# Patient Record
Sex: Female | Born: 1995 | Race: Asian | Hispanic: No | Marital: Married | Smoking: Never smoker
Health system: Southern US, Community
[De-identification: ages and names within clinical notes are randomized; demographics above are authoritative.]

## PROBLEM LIST (undated history)

## (undated) ENCOUNTER — Inpatient Hospital Stay (HOSPITAL_COMMUNITY): Payer: Self-pay

## (undated) DIAGNOSIS — J45909 Unspecified asthma, uncomplicated: Secondary | ICD-10-CM

## (undated) HISTORY — PX: NO PAST SURGERIES: SHX2092

---
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
## 2016-03-04 ENCOUNTER — Encounter (HOSPITAL_COMMUNITY): Payer: Self-pay

## 2016-09-26 ENCOUNTER — Encounter (HOSPITAL_COMMUNITY): Payer: Self-pay | Admitting: Emergency Medicine

## 2016-09-26 ENCOUNTER — Emergency Department (HOSPITAL_COMMUNITY)
Admission: EM | Admit: 2016-09-26 | Discharge: 2016-09-26 | Disposition: A | Discharge status: Home / Self Care | Payer: Medicaid Other | Attending: Emergency Medicine | Admitting: Emergency Medicine

## 2016-09-26 DIAGNOSIS — J45909 Unspecified asthma, uncomplicated: Secondary | ICD-10-CM | POA: Insufficient documentation

## 2016-09-26 DIAGNOSIS — R197 Diarrhea, unspecified: Secondary | ICD-10-CM | POA: Diagnosis not present

## 2016-09-26 DIAGNOSIS — R112 Nausea with vomiting, unspecified: Secondary | ICD-10-CM | POA: Diagnosis present

## 2016-09-26 LAB — COMPREHENSIVE METABOLIC PANEL
ALT: 15 U/L (ref 14–54)
AST: 21 U/L (ref 15–41)
Albumin: 3.8 g/dL (ref 3.5–5.0)
Alkaline Phosphatase: 50 U/L (ref 38–126)
Anion gap: 8 (ref 5–15)
BILIRUBIN TOTAL: 0.7 mg/dL (ref 0.3–1.2)
BUN: 13 mg/dL (ref 6–20)
CO2: 23 mmol/L (ref 22–32)
CREATININE: 0.74 mg/dL (ref 0.44–1.00)
Calcium: 8.3 mg/dL — ABNORMAL LOW (ref 8.9–10.3)
Chloride: 107 mmol/L (ref 101–111)
Glucose, Bld: 100 mg/dL — ABNORMAL HIGH (ref 65–99)
POTASSIUM: 3.4 mmol/L — AB (ref 3.5–5.1)
Sodium: 138 mmol/L (ref 135–145)
TOTAL PROTEIN: 6.9 g/dL (ref 6.5–8.1)

## 2016-09-26 LAB — URINALYSIS, ROUTINE W REFLEX MICROSCOPIC
Bilirubin Urine: NEGATIVE
Glucose, UA: NEGATIVE mg/dL
Hgb urine dipstick: NEGATIVE
KETONES UR: NEGATIVE mg/dL
LEUKOCYTES UA: NEGATIVE
NITRITE: NEGATIVE
PROTEIN: NEGATIVE mg/dL
Specific Gravity, Urine: 1.03 (ref 1.005–1.030)
pH: 5 (ref 5.0–8.0)

## 2016-09-26 LAB — CBC
HCT: 38.6 % (ref 36.0–46.0)
Hemoglobin: 13.1 g/dL (ref 12.0–15.0)
MCH: 28.5 pg (ref 26.0–34.0)
MCHC: 33.9 g/dL (ref 30.0–36.0)
MCV: 84.1 fL (ref 78.0–100.0)
PLATELETS: 202 10*3/uL (ref 150–400)
RBC: 4.59 MIL/uL (ref 3.87–5.11)
RDW: 14.1 % (ref 11.5–15.5)
WBC: 10.3 10*3/uL (ref 4.0–10.5)

## 2016-09-26 LAB — I-STAT BETA HCG BLOOD, ED (MC, WL, AP ONLY)

## 2016-09-26 LAB — LIPASE, BLOOD: Lipase: 30 U/L (ref 11–51)

## 2016-09-26 MED ORDER — POTASSIUM CHLORIDE CRYS ER 20 MEQ PO TBCR
20.0000 meq | EXTENDED_RELEASE_TABLET | Freq: Once | ORAL | Status: AC
  Administered 2016-09-26: 20 meq via ORAL
  Filled 2016-09-26: qty 1

## 2016-09-26 MED ORDER — ONDANSETRON 4 MG PO TBDP
8.0000 mg | ORAL_TABLET | Freq: Once | ORAL | Status: AC
  Administered 2016-09-26: 8 mg via ORAL
  Filled 2016-09-26: qty 2

## 2016-09-26 MED ORDER — ONDANSETRON HCL 4 MG PO TABS: 4.0000 mg | ORAL_TABLET | Freq: Three times a day (TID) | ORAL | 0 refills | Status: DC | PRN

## 2016-09-26 NOTE — Discharge Instructions (Signed)
I have attached information on nausea and vomiting. Take Zofran as directed for nausea. Follow with primary care in the next 2 days for persistent symptoms. He can return to the emergency department at any time if symptoms worsen.

## 2016-09-26 NOTE — ED Triage Notes (Signed)
Pt sts N/V x 2 days with dizziness and bilateral hand tingling

## 2016-09-26 NOTE — ED Provider Notes (Signed)
[facility] Provider Note   CSN: [id number] Arrival date & time: 09/26/16  1021     History   Chief Complaint Chief Complaint  Patient presents with  . Emesis    HPI Vanessa Macdonald is a 21 y.o. female with no significant past medical history who presents to the emergency department for nausea, vomiting, diarrhea that began yesterday at 6:30 PM. The patient notes that she was driving home from work yesterday when she became nauseous. When she returned home she had nonbilious, nonbloody emesis 10. She also experienced diarrhea 3. During the vomiting she noted mild, crampy lower abdominal pain. The pain is longer persists. Her last episode of vomiting and diarrhea was at 4 AM this morning. She has been able to eat a [place] for breakfast down. The patient also notes that she has the feeling of "her hands and feeling asleep "after vomiting this morning. She presents today for ongoing nausea. She denies fever, chills, bodyaches, sore throat, chest pain, shortness of breath, melena, urinary frequency, urinary hesitancy, vaginal bleeding, vaginal discharge. Patient's last period was last week. She notes that it was normal for her.  HPI  Past Medical History:  Diagnosis Date  . Asthma     There are no active problems to display for this patient.   Past Surgical History:  Procedure Laterality Date  . NO PAST SURGERIES      OB History    Gravida Para Term Preterm AB Living   2 1 1     1    SAB TAB Ectopic Multiple Live Births           1       Home Medications    Prior to Admission medications   Medication Sig Start Date End Date Taking? Authorizing Provider  acetaminophen (TYLENOL) 500 MG tablet Take 500 mg by mouth every 6 (six) hours as needed for moderate pain or fever.   Yes [provider]  ondansetron (ZOFRAN) 4 MG tablet Take 1 tablet (4 mg total) by mouth every 8 (eight) hours as needed for nausea or vomiting. 09/26/16   Jovane Foutz, Elmer SowMichael M, PA-C    Family  History History reviewed. No pertinent family history.  Social History Social History  Substance Use Topics  . Smoking status: Never Smoker  . Smokeless tobacco: Never Used  . Alcohol use No     Allergies   Patient has no known allergies.   Review of Systems Review of Systems  All other systems reviewed and are negative.    Physical Exam Updated Vital Signs BP 99/69 (BP Location: Left Arm)   Pulse 90   Temp 98.5 F (36.9 C) (Oral)   Resp 16   SpO2 100%   Physical Exam  Constitutional: She appears well-developed and well-nourished.  HENT:  Head: Normocephalic and atraumatic.  Right Ear: External ear normal.  Left Ear: External ear normal.  Mouth/Throat: Uvula is midline. Posterior oropharyngeal erythema (mild) present.  Eyes: Conjunctivae are normal. Pupils are equal, round, and reactive to light.  Neck: Neck supple.  Cardiovascular: Normal rate, regular rhythm and intact distal pulses.   No murmur heard. Pulmonary/Chest: Effort normal and breath sounds normal. She exhibits no tenderness.  Abdominal: Soft. Bowel sounds are normal. There is no tenderness. There is no rebound, no guarding, no CVA tenderness, no tenderness at McBurney's point and negative Murphy's sign.  Negatvie psoas and obturator sign.  Musculoskeletal: She exhibits no edema.  Lymphadenopathy:    She has no cervical adenopathy.  Neurological: She is alert. No sensory deficit.  Skin: No rash noted. She is not diaphoretic.  Psychiatric: She has a normal mood and affect.  Nursing note and vitals reviewed.    ED Treatments / Results  Labs (all labs ordered are listed, but only abnormal results are displayed) Labs Reviewed  COMPREHENSIVE METABOLIC PANEL - Abnormal; Notable for the following:       Result Value   Potassium 3.4 (*)    Glucose, Bld 100 (*)    Calcium 8.3 (*)    All other components within normal limits  URINALYSIS, ROUTINE W REFLEX MICROSCOPIC - Abnormal; Notable for the  following:    APPearance HAZY (*)    All other components within normal limits  LIPASE, BLOOD  CBC  I-STAT BETA HCG BLOOD, ED ([facility], [facility], [facility] ONLY)    EKG  EKG Interpretation None       Radiology No results found.  Procedures Procedures (including critical care time)  Medications Ordered in ED Medications  ondansetron (ZOFRAN-ODT) disintegrating tablet 8 mg (8 mg Oral Given 09/26/16 1303)  potassium chloride SA (K-DUR,KLOR-CON) CR tablet 20 mEq (20 mEq Oral Given 09/26/16 1349)     Initial Impression / Assessment and Plan / ED Course  I have reviewed the triage vital signs and the nursing notes.  Pertinent labs & imaging results that were available during my care of the patient were reviewed by me and considered in my medical decision making (see chart for details).     Zofran ODT given. We will by mouth fluid challenge as patient has established she was able to eat this morning. CBC, UA, hCG, lipase negative. Potassium low, replaced orally.  Patient nausea improved with zofran and po water. Able to hold down fluids. No vomiting currently. Patient without complaints. K replaced. Will send home with Zofran prescription. Suspect this is a viral etiology. Return precautions given. Patient to return at anytime is symptoms worsen.  Final Clinical Impressions(s) / ED Diagnoses   Final diagnoses:  Nausea and vomiting in adult    New Prescriptions New Prescriptions   ONDANSETRON (ZOFRAN) 4 MG TABLET    Take 1 tablet (4 mg total) by mouth every 8 (eight) hours as needed for nausea or vomiting.     Jacinto Halim, PA-C 09/26/16 1400    Little, Ambrose Finland, MD 09/26/16 [phone]

## 2016-12-25 ENCOUNTER — Emergency Department (HOSPITAL_BASED_OUTPATIENT_CLINIC_OR_DEPARTMENT_OTHER)
Admission: EM | Admit: 2016-12-25 | Discharge: 2016-12-25 | Disposition: A | Discharge status: Home / Self Care | Payer: Medicaid Other | Attending: Emergency Medicine | Admitting: Emergency Medicine

## 2016-12-25 ENCOUNTER — Emergency Department (HOSPITAL_BASED_OUTPATIENT_CLINIC_OR_DEPARTMENT_OTHER): Payer: Medicaid Other

## 2016-12-25 ENCOUNTER — Encounter (HOSPITAL_BASED_OUTPATIENT_CLINIC_OR_DEPARTMENT_OTHER): Payer: Self-pay | Admitting: *Deleted

## 2016-12-25 DIAGNOSIS — Y92009 Unspecified place in unspecified non-institutional (private) residence as the place of occurrence of the external cause: Secondary | ICD-10-CM | POA: Diagnosis not present

## 2016-12-25 DIAGNOSIS — J45909 Unspecified asthma, uncomplicated: Secondary | ICD-10-CM | POA: Diagnosis not present

## 2016-12-25 DIAGNOSIS — S161XXA Strain of muscle, fascia and tendon at neck level, initial encounter: Secondary | ICD-10-CM | POA: Insufficient documentation

## 2016-12-25 DIAGNOSIS — Y999 Unspecified external cause status: Secondary | ICD-10-CM | POA: Diagnosis not present

## 2016-12-25 DIAGNOSIS — Z79899 Other long term (current) drug therapy: Secondary | ICD-10-CM | POA: Insufficient documentation

## 2016-12-25 DIAGNOSIS — R51 Headache: Secondary | ICD-10-CM | POA: Diagnosis not present

## 2016-12-25 DIAGNOSIS — S0990XA Unspecified injury of head, initial encounter: Secondary | ICD-10-CM | POA: Diagnosis present

## 2016-12-25 DIAGNOSIS — Y939 Activity, unspecified: Secondary | ICD-10-CM | POA: Insufficient documentation

## 2016-12-25 MED ORDER — DIAZEPAM 5 MG PO TABS
5.0000 mg | ORAL_TABLET | Freq: Once | ORAL | Status: AC
  Administered 2016-12-25: 5 mg via ORAL
  Filled 2016-12-25: qty 1

## 2016-12-25 MED ORDER — IBUPROFEN 800 MG PO TABS
800.0000 mg | ORAL_TABLET | Freq: Once | ORAL | Status: AC
  Administered 2016-12-25: 800 mg via ORAL
  Filled 2016-12-25: qty 1

## 2016-12-25 MED ORDER — OXYCODONE HCL 5 MG PO TABS
5.0000 mg | ORAL_TABLET | Freq: Once | ORAL | Status: AC
  Administered 2016-12-25: 5 mg via ORAL
  Filled 2016-12-25: qty 1

## 2016-12-25 MED ORDER — ACETAMINOPHEN 500 MG PO TABS
1000.0000 mg | ORAL_TABLET | Freq: Once | ORAL | Status: AC
  Administered 2016-12-25: 1000 mg via ORAL
  Filled 2016-12-25: qty 2
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
## 2016-12-25 NOTE — Discharge Instructions (Signed)
Take 4 over the counter ibuprofen tablets 3 times a day or 2 over-the-counter naproxen tablets twice a day for pain. Also take tylenol 1000mg(2 extra strength) four times a day.    
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
## 2017-01-26 ENCOUNTER — Encounter (HOSPITAL_COMMUNITY): Payer: Self-pay | Admitting: *Deleted

## 2017-01-26 ENCOUNTER — Ambulatory Visit (HOSPITAL_COMMUNITY)
Admission: EM | Admit: 2017-01-26 | Discharge: 2017-01-26 | Disposition: A | Discharge status: Home / Self Care | Payer: Medicaid Other | Attending: Family Medicine | Admitting: Family Medicine

## 2017-01-26 DIAGNOSIS — Z3202 Encounter for pregnancy test, result negative: Secondary | ICD-10-CM | POA: Diagnosis not present

## 2017-01-26 DIAGNOSIS — X131XXA Other contact with steam and other hot vapors, initial encounter: Secondary | ICD-10-CM | POA: Diagnosis not present

## 2017-01-26 DIAGNOSIS — T22191A Burn of first degree of multiple sites of right shoulder and upper limb, except wrist and hand, initial encounter: Secondary | ICD-10-CM | POA: Diagnosis not present

## 2017-01-26 LAB — POCT PREGNANCY, URINE: PREG TEST UR: NEGATIVE

## 2017-01-26 MED ORDER — SILVER SULFADIAZINE 1 % EX CREA: 1.0000 "application " | TOPICAL_CREAM | Freq: Every day | CUTANEOUS | 0 refills | Status: DC
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
## 2017-01-26 NOTE — Discharge Instructions (Signed)
Your skin should heal up in the next few days. Keep clean and dry.

## 2017-02-03 ENCOUNTER — Emergency Department (HOSPITAL_BASED_OUTPATIENT_CLINIC_OR_DEPARTMENT_OTHER)
Admission: EM | Admit: 2017-02-03 | Discharge: 2017-02-03 | Disposition: A | Discharge status: Home / Self Care | Payer: Medicaid Other | Attending: Emergency Medicine | Admitting: Emergency Medicine

## 2017-02-03 ENCOUNTER — Encounter (HOSPITAL_BASED_OUTPATIENT_CLINIC_OR_DEPARTMENT_OTHER): Payer: Self-pay | Admitting: Emergency Medicine

## 2017-02-03 ENCOUNTER — Emergency Department (HOSPITAL_BASED_OUTPATIENT_CLINIC_OR_DEPARTMENT_OTHER): Payer: Medicaid Other

## 2017-02-03 DIAGNOSIS — R070 Pain in throat: Secondary | ICD-10-CM | POA: Diagnosis not present

## 2017-02-03 DIAGNOSIS — R05 Cough: Secondary | ICD-10-CM | POA: Insufficient documentation

## 2017-02-03 LAB — RAPID STREP SCREEN (MED CTR MEBANE ONLY): STREPTOCOCCUS, GROUP A SCREEN (DIRECT): NEGATIVE

## 2017-02-03 NOTE — ED Notes (Signed)
Pt called for room, no response. 

## 2017-02-03 NOTE — ED Triage Notes (Signed)
Cough and sore throat x4 days

## 2017-02-06 LAB — CULTURE, GROUP A STREP (THRC)

## 2017-03-25 ENCOUNTER — Encounter (HOSPITAL_COMMUNITY): Payer: Self-pay | Admitting: *Deleted

## 2017-03-25 ENCOUNTER — Inpatient Hospital Stay (HOSPITAL_COMMUNITY)
Admission: AD | Admit: 2017-03-25 | Discharge: 2017-03-26 | Disposition: A | Discharge status: Home / Self Care | Payer: Medicaid Other | Source: Ambulatory Visit | Attending: Obstetrics & Gynecology | Admitting: Obstetrics & Gynecology

## 2017-03-25 DIAGNOSIS — R1032 Left lower quadrant pain: Secondary | ICD-10-CM | POA: Insufficient documentation

## 2017-03-25 DIAGNOSIS — K529 Noninfective gastroenteritis and colitis, unspecified: Secondary | ICD-10-CM

## 2017-03-25 DIAGNOSIS — Z3202 Encounter for pregnancy test, result negative: Secondary | ICD-10-CM | POA: Diagnosis not present

## 2017-03-25 DIAGNOSIS — R5383 Other fatigue: Secondary | ICD-10-CM | POA: Diagnosis present

## 2017-03-25 DIAGNOSIS — J45909 Unspecified asthma, uncomplicated: Secondary | ICD-10-CM | POA: Insufficient documentation

## 2017-03-25 DIAGNOSIS — R110 Nausea: Secondary | ICD-10-CM | POA: Insufficient documentation

## 2017-03-25 LAB — POCT PREGNANCY, URINE: PREG TEST UR: NEGATIVE

## 2017-03-25 LAB — URINALYSIS, ROUTINE W REFLEX MICROSCOPIC
Bilirubin Urine: NEGATIVE
Glucose, UA: NEGATIVE mg/dL
Hgb urine dipstick: NEGATIVE
Ketones, ur: NEGATIVE mg/dL
Leukocytes, UA: NEGATIVE
NITRITE: NEGATIVE
PH: 5 (ref 5.0–8.0)
Protein, ur: NEGATIVE mg/dL
SPECIFIC GRAVITY, URINE: 1.026 (ref 1.005–1.030)

## 2017-03-25 LAB — WET PREP, GENITAL
CLUE CELLS WET PREP: NONE SEEN
SPERM: NONE SEEN
TRICH WET PREP: NONE SEEN
Yeast Wet Prep HPF POC: NONE SEEN

## 2017-03-25 LAB — CBC
HEMATOCRIT: 38.2 % (ref 36.0–46.0)
HEMOGLOBIN: 13.1 g/dL (ref 12.0–15.0)
MCH: 29.5 pg (ref 26.0–34.0)
MCHC: 34.3 g/dL (ref 30.0–36.0)
MCV: 86 fL (ref 78.0–100.0)
Platelets: 205 10*3/uL (ref 150–400)
RBC: 4.44 MIL/uL (ref 3.87–5.11)
RDW: 13.8 % (ref 11.5–15.5)
WBC: 7.7 10*3/uL (ref 4.0–10.5)

## 2017-03-25 MED ORDER — KETOROLAC TROMETHAMINE 30 MG/ML IJ SOLN
60.0000 mg | Freq: Once | INTRAMUSCULAR | Status: AC
  Administered 2017-03-25: 60 mg via INTRAMUSCULAR
  Filled 2017-03-25: qty 2

## 2017-03-25 NOTE — MAU Note (Signed)
Pt c/o left lower abd pain for the past 3 days. Reports 1 liquid BM today and denies any urinary problems. Requests medication for the nausea.

## 2017-03-25 NOTE — MAU Provider Note (Signed)
History     CSN: [id number]  Arrival date and time: 03/25/17 [id number]   First Provider Initiated Contact with Patient 03/25/17 2307      Chief Complaint  Patient presents with  . Nausea  . Fatigue   21 y.o. non-pregnant female here with LLQ pain and nausea x2-3 days. Pain is intermittent and sharp. Has not tried anything for it. Had episode of diarrhea today. No known sick contacts. No fevers. Having nausea but no vomiting. No vaginal discharge or pelvic pain.    Past Medical History:  Diagnosis Date  . Asthma     Past Surgical History:  Procedure Laterality Date  . NO PAST SURGERIES      History reviewed. No pertinent family history.  Social History   Tobacco Use  . Smoking status: Never Smoker  . Smokeless tobacco: Never Used  Substance Use Topics  . Alcohol use: No  . Drug use: No    Allergies: No Known Allergies  Medications Prior to Admission  Medication Sig Dispense Refill Last Dose  . acetaminophen (TYLENOL) 500 MG tablet Take 500 mg by mouth every 6 (six) hours as needed for moderate pain or fever.   More than a month at Unknown time  . ondansetron (ZOFRAN) 4 MG tablet Take 1 tablet (4 mg total) by mouth every 8 (eight) hours as needed for nausea or vomiting. 4 tablet 0 More than a month at Unknown time  . silver sulfADIAZINE (SILVADENE) 1 % cream Apply 1 application topically daily. 25 g 0 More than a month at Unknown time    Review of Systems  Constitutional: Negative for chills and fever.  Gastrointestinal: Positive for abdominal pain, diarrhea and nausea. Negative for vomiting.  Genitourinary: Negative for dysuria, frequency, hematuria, urgency, vaginal bleeding and vaginal discharge.   Physical Exam   Blood pressure 108/73, pulse 72, temperature 98.3 F (36.8 C), temperature source Oral, resp. rate 16, height 5' (1.524 m), weight 115 lb (52.2 kg), last menstrual period 03/19/2017, SpO2 100 %.  Physical Exam  Nursing note and vitals  reviewed. Constitutional: She is oriented to person, place, and time. She appears well-developed and well-nourished.  HENT:  Head: Normocephalic and atraumatic.  Neck: Normal range of motion.  Cardiovascular: Normal rate.  Respiratory: Effort normal.  GI: Soft. She exhibits no distension and no mass. There is no tenderness. There is no rebound and no guarding.  Genitourinary:  Genitourinary Comments: External: no lesions or erythema Vagina: rugated, pink, moist, scant thin white discharge Uterus: non enlarged, anteverted, no tender, no CMT Adnexae: no masses, + tenderness left, no tenderness right   Musculoskeletal: Normal range of motion.  Neurological: She is alert and oriented to person, place, and time.  Skin: Skin is warm and dry.  Psychiatric: She has a normal mood and affect.   Results for orders placed or performed during the hospital encounter of 03/25/17 (from the past 24 hour(s))  Urinalysis, Routine w reflex microscopic     Status: None   Collection Time: 03/25/17  7:35 PM  Result Value Ref Range   Color, Urine YELLOW YELLOW   APPearance CLEAR CLEAR   Specific Gravity, Urine 1.026 1.005 - 1.030   pH 5.0 5.0 - 8.0   Glucose, UA NEGATIVE NEGATIVE mg/dL   Hgb urine dipstick NEGATIVE NEGATIVE   Bilirubin Urine NEGATIVE NEGATIVE   Ketones, ur NEGATIVE NEGATIVE mg/dL   Protein, ur NEGATIVE NEGATIVE mg/dL   Nitrite NEGATIVE NEGATIVE   Leukocytes, UA NEGATIVE NEGATIVE  Pregnancy, urine  POC     Status: None   Collection Time: 03/25/17  8:02 PM  Result Value Ref Range   Preg Test, Ur NEGATIVE NEGATIVE  Wet prep, genital     Status: Abnormal   Collection Time: 03/25/17 11:15 PM  Result Value Ref Range   Yeast Wet Prep HPF POC NONE SEEN NONE SEEN   Trich, Wet Prep NONE SEEN NONE SEEN   Clue Cells Wet Prep HPF POC NONE SEEN NONE SEEN   WBC, Wet Prep HPF POC MANY (A) NONE SEEN   Sperm NONE SEEN   CBC     Status: None   Collection Time: 03/25/17 11:17 PM  Result Value Ref  Range   WBC 7.7 4.0 - 10.5 K/uL   RBC 4.44 3.87 - 5.11 MIL/uL   Hemoglobin 13.1 12.0 - 15.0 g/dL   HCT 09.838.2 11.936.0 - 14.746.0 %   MCV 86.0 78.0 - 100.0 fL   MCH 29.5 26.0 - 34.0 pg   MCHC 34.3 30.0 - 36.0 g/dL   RDW 82.913.8 56.211.5 - 13.015.5 %   Platelets 205 150 - 400 K/uL   MAU Course  Procedures Toradol IM  MDM Labs ordered and reviewed. No evidence of acute abdominal or pelvic process. Pain improved after med. Discussed supportive measures for gastroenteritis. Stable for discharge home.  Assessment and Plan   1. Gastroenteritis   2. Pregnancy examination or test, negative result    Discharge home Follow up with PCP as needed Rx Phenergan Return to MAU for OBGYN emergencies  Allergies as of 03/26/2017   No Known Allergies     Medication List    STOP taking these medications   ondansetron 4 MG tablet Commonly known as:  ZOFRAN     TAKE these medications   acetaminophen 500 MG tablet Commonly known as:  TYLENOL Take 500 mg by mouth every 6 (six) hours as needed for moderate pain or fever.   promethazine 25 MG tablet Commonly known as:  PHENERGAN Take 1 tablet (25 mg total) by mouth every 6 (six) hours as needed for nausea or vomiting.   silver sulfADIAZINE 1 % cream Commonly known as:  SILVADENE Apply 1 application topically daily.      Donette LarryMelanie Cherene Dobbins, CNM 03/25/2017, 11:15 PM

## 2017-03-25 NOTE — MAU Note (Signed)
Pt here with c/o nausea and fatigue for the past few days. Unsure if pregnant. LMP 11/20

## 2017-03-26 DIAGNOSIS — Z3202 Encounter for pregnancy test, result negative: Secondary | ICD-10-CM

## 2017-03-26 DIAGNOSIS — K529 Noninfective gastroenteritis and colitis, unspecified: Secondary | ICD-10-CM

## 2017-03-26 MED ORDER — PROMETHAZINE HCL 25 MG PO TABS: 25.0000 mg | ORAL_TABLET | Freq: Four times a day (QID) | ORAL | 0 refills | Status: DC | PRN

## 2017-03-26 NOTE — Discharge Instructions (Signed)
In late 2019, the [facility] will be moving to the [facility]. At that time, the MAU (Maternity Admissions Unit), where you are being seen today, will no longer see non-pregnant patients. We strongly encourage you to find a doctor's office before that time, so that you can be seen with any GYN concerns, like vaginal discharge, urinary tract infection, etc.. in a timely manner.   In order to make the office visit more convenient, the Center [facility] at [facility] will be offering evening hours from 4pm-7:30pm on Monday. There will be same-day appointments, walk-in appointments and scheduled appointments available during this time. We will be adding more evening hours over the next year before the move.   [facility] @ [facility] [phone]  For urgent needs, [facility] is also available for management of urgent GYN complaints such as vaginal discharge or urinary tract infections.      Bland Diet A bland diet consists of foods that do not have a lot of fat or fiber. Foods without fat or fiber are easier for the body to digest. They are also less likely to irritate your mouth, throat, stomach, and other parts of your gastrointestinal tract. A bland diet is sometimes called a BRAT diet. What is my plan? Your health care provider or dietitian may recommend specific changes to your diet to prevent and treat your symptoms, such as:  Eating small meals often.  Cooking food until it is soft enough to chew easily.  Chewing your food well.  Drinking fluids slowly.  Not eating foods that are very spicy, sour, or fatty.  Not eating citrus fruits, such as oranges and grapefruit.  What do I need to know about this diet?  Eat a variety of foods from the bland diet food list.  Do not follow a bland diet longer than you have to.  Ask your health care provider whether you should take vitamins. What foods can I  eat? Grains  Hot cereals, such as cream of wheat. Bread, crackers, or tortillas made from refined white flour. Rice. Vegetables Canned or cooked vegetables. Mashed or boiled potatoes. Fruits Bananas. Applesauce. Other types of cooked or canned fruit with the skin and seeds removed, such as canned peaches or pears. Meats and Other Protein Sources Scrambled eggs. Creamy peanut butter or other nut butters. Lean, well-cooked meats, such as chicken or fish. Tofu. Soups or broths. Dairy Low-fat dairy products, such as milk, cottage cheese, or yogurt. Beverages Water. Herbal tea. Apple juice. Sweets and Desserts Pudding. Custard. Fruit gelatin. Ice cream. Fats and Oils Mild salad dressings. Canola or olive oil. The items listed above may not be a complete list of allowed foods or beverages. Contact your dietitian for more options. What foods are not recommended? Foods and ingredients that are often not recommended include:  Spicy foods, such as hot sauce or salsa.  Fried foods.  Sour foods, such as pickled or fermented foods.  Raw vegetables or fruits, especially citrus or berries.  Caffeinated drinks.  Alcohol.  Strongly flavored seasonings or condiments.  The items listed above may not be a complete list of foods and beverages that are not allowed. Contact your dietitian for more information. This information is not intended to replace advice given to you by your health care provider. Make sure you discuss any questions you have with your health care provider. Document Released: 08/08/2015 Document Revised: 09/22/2015 Document Reviewed: 04/28/2014 Elsevier Interactive Patient Education  2018  Elsevier Inc.  Viral Gastroenteritis, Adult Viral gastroenteritis is also known as the stomach flu. This condition is caused by certain germs (viruses). These germs can be passed from person to person very easily (are very contagious). This condition can cause sudden watery poop (diarrhea),  fever, and throwing up (vomiting). Having watery poop and throwing up can make you feel weak and cause you to get dehydrated. Dehydration can make you tired and thirsty, make you have a dry mouth, and make it so you pee (urinate) less often. Older adults and people with other diseases or a weak defense system (immune system) are at higher risk for dehydration. It is important to replace the fluids that you lose from having watery poop and throwing up. Follow these instructions at home: Follow instructions from your doctor about how to care for yourself at home. Eating and drinking  Follow these instructions as told by your doctor:  Take an oral rehydration solution (ORS). This is a drink that is sold at pharmacies and stores.  Drink clear fluids in small amounts as you are able, such as: ? Water. ? Ice chips. ? Diluted fruit juice. ? Low-calorie sports drinks.  Eat bland, easy-to-digest foods in small amounts as you are able, such as: ? Bananas. ? Applesauce. ? Rice. ? Low-fat (lean) meats. ? Toast. ? Crackers.  Avoid fluids that have a lot of sugar or caffeine in them.  Avoid alcohol.  Avoid spicy or fatty foods.  General instructions  Drink enough fluid to keep your pee (urine) clear or pale yellow.  Wash your hands often. If you cannot use soap and water, use hand sanitizer.  Make sure that all people in your home wash their hands well and often.  Rest at home while you get better.  Take over-the-counter and prescription medicines only as told by your doctor.  Watch your condition for any changes.  Take a warm bath to help with any burning or pain from having watery poop.  Keep all follow-up visits as told by your doctor. This is important. Contact a doctor if:  You cannot keep fluids down.  Your symptoms get worse.  You have new symptoms.  You feel light-headed or dizzy.  You have muscle cramps. Get help right away if:  You have chest pain.  You feel  very weak or you pass out (faint).  You see blood in your throw-up.  Your throw-up looks like coffee grounds.  You have bloody or black poop (stools) or poop that look like tar.  You have a very bad headache, a stiff neck, or both.  You have a rash.  You have very bad pain, cramping, or bloating in your belly (abdomen).  You have trouble breathing.  You are breathing very quickly.  Your heart is beating very quickly.  Your skin feels cold and clammy.  You feel confused.  You have pain when you pee.  You have signs of dehydration, such as: ? Dark pee, hardly any pee, or no pee. ? Cracked lips. ? Dry mouth. ? Sunken eyes. ? Sleepiness. ? Weakness. This information is not intended to replace advice given to you by your health care provider. Make sure you discuss any questions you have with your health care provider. Document Released: 10/03/2007 Document Revised: 11/04/2015 Document Reviewed: 12/21/2014 Elsevier Interactive Patient Education  2017 [employer]

## 2017-03-27 LAB — GC/CHLAMYDIA PROBE AMP (~~LOC~~) NOT AT ARMC
CHLAMYDIA, DNA PROBE: NEGATIVE
NEISSERIA GONORRHEA: NEGATIVE

## 2017-04-29 ENCOUNTER — Emergency Department (HOSPITAL_COMMUNITY)
Admission: EM | Admit: 2017-04-29 | Discharge: 2017-04-30 | Disposition: A | Discharge status: Home / Self Care | Payer: Medicaid Other | Attending: Emergency Medicine | Admitting: Emergency Medicine

## 2017-04-29 ENCOUNTER — Emergency Department (HOSPITAL_COMMUNITY): Payer: Medicaid Other

## 2017-04-29 ENCOUNTER — Encounter (HOSPITAL_COMMUNITY): Payer: Self-pay

## 2017-04-29 DIAGNOSIS — J45909 Unspecified asthma, uncomplicated: Secondary | ICD-10-CM | POA: Diagnosis not present

## 2017-04-29 DIAGNOSIS — O26891 Other specified pregnancy related conditions, first trimester: Secondary | ICD-10-CM | POA: Diagnosis present

## 2017-04-29 DIAGNOSIS — R109 Unspecified abdominal pain: Secondary | ICD-10-CM

## 2017-04-29 DIAGNOSIS — O99511 Diseases of the respiratory system complicating pregnancy, first trimester: Secondary | ICD-10-CM | POA: Diagnosis not present

## 2017-04-29 DIAGNOSIS — Z3A01 Less than 8 weeks gestation of pregnancy: Secondary | ICD-10-CM | POA: Insufficient documentation

## 2017-04-29 LAB — URINALYSIS, ROUTINE W REFLEX MICROSCOPIC
Bilirubin Urine: NEGATIVE
GLUCOSE, UA: NEGATIVE mg/dL
HGB URINE DIPSTICK: NEGATIVE
Ketones, ur: NEGATIVE mg/dL
Leukocytes, UA: NEGATIVE
Nitrite: NEGATIVE
Protein, ur: NEGATIVE mg/dL
SPECIFIC GRAVITY, URINE: 1.016 (ref 1.005–1.030)
pH: 7 (ref 5.0–8.0)

## 2017-04-29 LAB — COMPREHENSIVE METABOLIC PANEL
ALT: 15 U/L (ref 14–54)
ANION GAP: 6 (ref 5–15)
AST: 18 U/L (ref 15–41)
Albumin: 4.1 g/dL (ref 3.5–5.0)
Alkaline Phosphatase: 46 U/L (ref 38–126)
BUN: 9 mg/dL (ref 6–20)
CHLORIDE: 108 mmol/L (ref 101–111)
CO2: 23 mmol/L (ref 22–32)
Calcium: 8.7 mg/dL — ABNORMAL LOW (ref 8.9–10.3)
Creatinine, Ser: 0.58 mg/dL (ref 0.44–1.00)
GFR calc non Af Amer: >60 mL/min (ref >60)
Glucose, Bld: 79 mg/dL (ref 65–99)
Potassium: 3.5 mmol/L (ref 3.5–5.1)
SODIUM: 137 mmol/L (ref 135–145)
Total Bilirubin: 0.8 mg/dL (ref 0.3–1.2)
Total Protein: 7.3 g/dL (ref 6.5–8.1)

## 2017-04-29 LAB — CBC
HCT: 35.3 % — ABNORMAL LOW (ref 36.0–46.0)
HEMOGLOBIN: 12.8 g/dL (ref 12.0–15.0)
MCH: 30 pg (ref 26.0–34.0)
MCHC: 36.3 g/dL — ABNORMAL HIGH (ref 30.0–36.0)
MCV: 82.7 fL (ref 78.0–100.0)
Platelets: 191 10*3/uL (ref 150–400)
RBC: 4.27 MIL/uL (ref 3.87–5.11)
RDW: 14.1 % (ref 11.5–15.5)
WBC: 7.8 10*3/uL (ref 4.0–10.5)

## 2017-04-29 LAB — I-STAT BETA HCG BLOOD, ED (MC, WL, AP ONLY): HCG, QUANTITATIVE: 1997.4 m[IU]/mL — AB (ref <5)

## 2017-04-29 LAB — LIPASE, BLOOD: LIPASE: 42 U/L (ref 11–51)

## 2017-04-29 LAB — POC URINE PREG, ED: PREG TEST UR: POSITIVE — AB

## 2017-04-29 NOTE — Discharge Instructions (Signed)
Your ultrasound today does not show a pregnancy yet. It may be to early to see or it may be a pregnancy that is not in the uterus. It is important that you follow up on Thursday at 11 am at the [facility] to repeat the pregnancy hormone level and be sure it is rising appropriately. Then you will need to have an ultrasound repeated. NO SEX, NO TAMPONS, NOTHING IN THE VAGINA UNTIL FOLLOW UP. IF YOUR SYMPTOMS WORSEN GO TO [facility] FOR FURTHER EVALUATION.  You may take tylenol as needed for pain.

## 2017-04-29 NOTE — ED Triage Notes (Signed)
Pt complains of abdominal and back pain for two days, states that she had a positive home pregnancy test and last menstrual period was 11-20 She has hx of miscarriages about 2 years ao Pt complains of being nauseated and tired but no vomiting

## 2017-04-29 NOTE — ED Provider Notes (Signed)
[facility]-EMERGENCY DEPT Provider Note   CSN: [id number] Arrival date & time: 04/29/17  1902     History   Chief Complaint Chief Complaint  Patient presents with  . Abdominal Pain    HPI Antonya SUSANNE BAUMGARNER is a 21 y.o. [id number] @ [redacted] weeks gestation who presents to the ED with abdominal pain. The pain started 2 days ago and has gotten worse. The pain is located in the lower abdomen. The pain comes and goes. patient describes the pain as cramping that radiates to the lower back.  Patient has not started prenatal care. She delivered her child and had her care for miscarriages in [facility] and plans to have her care for this pregnancy there as well. Patient reports that she was here in [place] tonight at her mother's home and decided to come to the ED. Patient denies vaginal bleeding.   The history is provided by the patient. No language interpreter was used.  Abdominal Pain   This is a new problem. The current episode started 2 days ago. The problem has been gradually worsening. The pain is at a severity of 7/10. Associated symptoms include anorexia and nausea. Pertinent negatives include fever, dysuria, frequency, hematuria and headaches. Nothing aggravates the symptoms. Nothing relieves the symptoms.    Past Medical History:  Diagnosis Date  . Asthma     There are no active problems to display for this patient.   Past Surgical History:  Procedure Laterality Date  . NO PAST SURGERIES      OB History    Gravida Para Term Preterm AB Living   2 1 1   1 1    SAB TAB Ectopic Multiple Live Births   1       1       Home Medications    Prior to Admission medications   Not on File    Family History History reviewed. No pertinent family history.  Social History Social History   Tobacco Use  . Smoking status: Never Smoker  . Smokeless tobacco: Never Used  Substance Use Topics  . Alcohol use: No  . Drug use: No     Allergies   Patient has no known  allergies.   Review of Systems Review of Systems  Constitutional: Negative for fever.  HENT: Positive for congestion.   Respiratory: Negative for cough and shortness of breath.   Cardiovascular: Negative for chest pain.  Gastrointestinal: Positive for abdominal pain, anorexia and nausea.  Genitourinary: Negative for dysuria, frequency and hematuria.  Musculoskeletal: Positive for back pain.  Neurological: Negative for headaches.  Psychiatric/Behavioral: Negative for confusion.     Physical Exam Updated Vital Signs BP 99/85 (BP Location: Right Arm)   Pulse 69   Temp 97.9 F (36.6 C) (Oral)   Resp 18   LMP 03/19/2017   SpO2 100%   Physical Exam  Constitutional: She appears well-developed and well-nourished. No distress.  HENT:  Head: Normocephalic and atraumatic.  Eyes: EOM are normal.  Neck: Neck supple.  Pulmonary/Chest: Effort normal.  Abdominal: Soft. Bowel sounds are normal. There is tenderness. There is no rebound, no guarding and no CVA tenderness.  Lower abdominal tenderness with palpation.  Musculoskeletal: Normal range of motion.  Neurological: She is alert.  Skin: Skin is warm and dry.  Psychiatric: She has a normal mood and affect.  Nursing note and vitals reviewed.    ED Treatments / Results  Labs (all labs ordered are listed, but only abnormal results are  displayed) Labs Reviewed  COMPREHENSIVE METABOLIC PANEL - Abnormal; Notable for the following components:      Result Value   Calcium 8.7 (*)    All other components within normal limits  CBC - Abnormal; Notable for the following components:   HCT 35.3 (*)    MCHC 36.3 (*)    All other components within normal limits  I-STAT BETA HCG BLOOD, ED ([facility], [facility], [facility] ONLY) - Abnormal; Notable for the following components:   I-stat hCG, quantitative 1,997.4 (*)    All other components within normal limits  POC URINE PREG, ED - Abnormal; Notable for the following components:   Preg Test, Ur POSITIVE (*)     All other components within normal limits  LIPASE, BLOOD  URINALYSIS, ROUTINE W REFLEX MICROSCOPIC   Radiology [place] Ob Comp Less 14 Wks  Result Date: 04/29/2017 CLINICAL DATA:  21 y/o F; 3 days of abdominal pain. Quantitative beta HCG 1,997. EXAM: OBSTETRIC <14 WK US AND TRANSVAGINAL OB US TECHNIQUE: Both transabdominal and transvaginal ultrasound examinations were performed for complete evaluation of the gestation as well as the maternal uterus, adnexal regions, and pelvic cul-de-sac. Transvaginal technique was performed to assess early pregnancy. COMPARISON:  06/23/2015 pelvic ultrasound. FINDINGS: Intrauterine gestational sac: None Yolk sac:  Not Visualized. Embryo:  Not Visualized. Cardiac Activity: Not Visualized. Subchorionic hemorrhage:  None visualized. Maternal uterus/adnexae: Complex echogenic focus with enhanced through transmission and peripheral vascularity within the left ovary measuring up to 1.9 cm. Small volume of simple free fluid. IMPRESSION: 1. Pregnancy of unknown location. Intrauterine pregnancy may not be identified with a beta HCG of less than 3,000. Follow-up beta HCG and possible pelvic ultrasound as indicated is recommended to evaluate pregnancy location. This recommendation follows SRU consensus guidelines: Diagnostic Criteria for Nonviable Pregnancy Early in the First Trimester. Malva Limes Engl J Med 2013; 086:5784-69; 369:1443-51. 2. Echogenic focus in left ovary measuring up to 1.9 cm, probably corpus luteum. Ovarian ectopic is unlikely. 3. Small volume of simple fluid in the pelvis. Electronically Signed   By: Mitzi HansenLance  Furusawa-Stratton M.D.   On: 04/29/2017 23:46   [place] Ob Transvaginal  Result Date: 04/29/2017 CLINICAL DATA:  21 y/o F; 3 days of abdominal pain. Quantitative beta HCG 1,997. EXAM: OBSTETRIC <14 WK US AND TRANSVAGINAL OB US TECHNIQUE: Both transabdominal and transvaginal ultrasound examinations were performed for complete evaluation of the gestation as well as the maternal uterus,  adnexal regions, and pelvic cul-de-sac. Transvaginal technique was performed to assess early pregnancy. COMPARISON:  06/23/2015 pelvic ultrasound. FINDINGS: Intrauterine gestational sac: None Yolk sac:  Not Visualized. Embryo:  Not Visualized. Cardiac Activity: Not Visualized. Subchorionic hemorrhage:  None visualized. Maternal uterus/adnexae: Complex echogenic focus with enhanced through transmission and peripheral vascularity within the left ovary measuring up to 1.9 cm. Small volume of simple free fluid. IMPRESSION: 1. Pregnancy of unknown location. Intrauterine pregnancy may not be identified with a beta HCG of less than 3,000. Follow-up beta HCG and possible pelvic ultrasound as indicated is recommended to evaluate pregnancy location. This recommendation follows SRU consensus guidelines: Diagnostic Criteria for Nonviable Pregnancy Early in the First Trimester. Malva Limes Engl J Med 2013; 629:5284-13; 369:1443-51. 2. Echogenic focus in left ovary measuring up to 1.9 cm, probably corpus luteum. Ovarian ectopic is unlikely. 3. Small volume of simple fluid in the pelvis. Electronically Signed   By: Mitzi HansenLance  Furusawa-Stratton M.D.   On: 04/29/2017 23:46    Procedures Procedures (including critical care time)  Medications Ordered in ED Medications - No data to display  11:55 pm [facility]. Will give patient strict ectopic precautions and [facility] for repeat Bhcg and evaluation. Return precautions discussed.   Initial Impression / Assessment and Plan / ED Course  I have reviewed the triage vital signs and the nursing notes. 21 y.o. [id number] @ approximately [redacted] weeks gestation here with abdominal pain. Ultrasound without IUP identified. There is an area in the left ovary that appears as a CLC. Discussed in detail with the patient clinical and ultrasound findings and plan of care patient agrees with plan.   Final Clinical Impressions(s) / ED Diagnoses   Final diagnoses:    Abdominal pain during pregnancy in first trimester    ED Discharge Orders    None       Kerrie Buffaloeese, Gerri Acre JasperM, NP 04/30/17 Marlyne Beards0002    Arby BarrettePfeiffer, Marcy, MD 05/03/17 1754

## 2017-05-02 ENCOUNTER — Ambulatory Visit: Payer: Medicaid Other | Admitting: *Deleted

## 2017-05-02 DIAGNOSIS — O3680X0 Pregnancy with inconclusive fetal viability, not applicable or unspecified: Secondary | ICD-10-CM

## 2017-05-02 LAB — HCG, QUANTITATIVE, PREGNANCY: hCG, Beta Chain, Quant, S: 8260 m[IU]/mL — ABNORMAL HIGH (ref <5)

## 2017-05-02 NOTE — Progress Notes (Signed)
Patient seen and assessed by nursing staff.  Agree with documentation and plan.  

## 2017-05-02 NOTE — Progress Notes (Signed)
Patient here for stat bhcg today. Patient reports some cramping on her left side, rating pain a 5. Patient denies bleeding. Discussed with patient in most pregnancies bhcg levels double about every 48 hours, waiting in lobby for results & updated plan of care. Patient verbalized understanding and has no questions at this time.   Results received, reviewed with Dr Shawnie PonsPratt. Pt needs u/s this week to assess viability. Discussed this with pt., understanding voiced. U/s scheduled for tomorrow @ 3pm. Ectopic precautions discussed. Patient voiced understanding.

## 2017-05-03 ENCOUNTER — Ambulatory Visit (HOSPITAL_COMMUNITY): Payer: Medicaid Other

## 2017-05-03 ENCOUNTER — Encounter (HOSPITAL_COMMUNITY): Payer: Self-pay

## 2017-05-03 ENCOUNTER — Ambulatory Visit (HOSPITAL_COMMUNITY)
Admission: RE | Admit: 2017-05-03 | Discharge: 2017-05-03 | Disposition: A | Discharge status: Home / Self Care | Payer: Medicaid Other | Source: Ambulatory Visit | Attending: Family Medicine | Admitting: Family Medicine

## 2017-05-03 DIAGNOSIS — O3680X0 Pregnancy with inconclusive fetal viability, not applicable or unspecified: Secondary | ICD-10-CM | POA: Insufficient documentation

## 2017-05-03 DIAGNOSIS — Z3A01 Less than 8 weeks gestation of pregnancy: Secondary | ICD-10-CM | POA: Insufficient documentation

## 2017-05-03 MED ORDER — PRENATAL 27-0.8 MG PO TABS: 1.0000 | ORAL_TABLET | Freq: Every day | ORAL | 9 refills | Status: DC

## 2017-05-03 NOTE — Discharge Instructions (Signed)
[facility] [place] [facility] [place]  [place] [facility] Providers    Center for [facility]       Phone: [phone]  [facility] for [facility] [place] Phone: [phone]  [facility] [place]  Phone: [phone]  [facility] at [facility]  Phone: [phone]  [facility] [place]  Phone: [phone]  [facility]       Phone: [phone]  [facility]    Phone: [phone]   [facility]    Phone: [phone]  [facility]    Phone: [phone]  [facility]    Phone: [phone]  [facility]    Phone: [phone]  [facility]       Phone: [phone]   [facility]  Phone: [phone]  [facility]    Phone: [phone]  [facility] [place]   Phone: [phone]  [facility]      Phone: [phone]  [facility]    Phone: [phone]   Safe Medications in Pregnancy   Acne: Benzoyl Peroxide Salicylic Acid  Backache/Headache: Tylenol: 2 regular strength every 4 hours OR              2 Extra strength every 6 hours  Colds/Coughs/Allergies: Benadryl (alcohol free) 25 mg every 6 hours as needed Breath right strips Claritin Cepacol throat lozenges Chloraseptic throat spray Cold-Eeze- up to three times per day Cough drops, alcohol free Flonase (by prescription only) Guaifenesin Mucinex Robitussin DM (plain only, alcohol free) Saline nasal spray/drops Sudafed (pseudoephedrine) & Actifed ** use only after [redacted] weeks gestation and if you do not have high blood pressure Tylenol Vicks Vaporub Zinc lozenges Zyrtec   Constipation: Colace Ducolax  suppositories Fleet enema Glycerin suppositories Metamucil Milk of magnesia Miralax Senokot Smooth move tea  Diarrhea: Kaopectate Imodium A-D  *NO pepto Bismol  Hemorrhoids: Anusol Anusol HC Preparation H Tucks  Indigestion: Tums Maalox Mylanta Zantac  Pepcid  Insomnia: Benadryl (alcohol free) 25mg  every 6 hours as needed Tylenol PM Unisom, no Gelcaps  Leg Cramps: Tums MagGel  Nausea/Vomiting:  Bonine Dramamine Emetrol Ginger extract Sea bands Meclizine  Nausea medication to take during pregnancy:  Unisom (doxylamine succinate 25 mg tablets) Take one tablet daily at bedtime. If symptoms are not adequately controlled, the dose can be increased to a maximum recommended dose of two tablets daily (1/2 tablet in the morning, 1/2 tablet mid-afternoon and one at bedtime). Vitamin B6 100mg  tablets. Take one tablet twice a day (up to 200 mg per day).  Skin Rashes: Aveeno products Benadryl cream or 25mg  every 6 hours as needed Calamine Lotion 1% cortisone cream  Yeast infection: Gyne-lotrimin 7 Monistat 7   **If taking multiple medications, please check labels to avoid duplicating the same active ingredients **take medication as directed on the label ** Do not exceed 4000 mg of tylenol in 24 hours **Do not take medications that contain aspirin or ibuprofen

## 2017-05-03 NOTE — Progress Notes (Signed)
Ultrasound results reviewed with patient and all questions answered. Patient to have follow up ultrasound in 1 week to confirm viability. Bleeding and pain precautions reviewed with patient. Prenatal vitamins prescribed and list of safe medications in pregnancy reviewed. Encouraged patient to start prenatal care.  Rolm BookbinderCaroline M Treyshaun Keatts, CNM 05/03/17 3:32 PM

## 2017-05-04 ENCOUNTER — Inpatient Hospital Stay (HOSPITAL_COMMUNITY)
Admission: AD | Admit: 2017-05-04 | Discharge: 2017-05-04 | Disposition: A | Discharge status: Home / Self Care | Payer: Medicaid Other | Source: Ambulatory Visit | Attending: Obstetrics and Gynecology | Admitting: Obstetrics and Gynecology

## 2017-05-04 ENCOUNTER — Other Ambulatory Visit: Payer: Self-pay

## 2017-05-04 DIAGNOSIS — Z3A01 Less than 8 weeks gestation of pregnancy: Secondary | ICD-10-CM | POA: Insufficient documentation

## 2017-05-04 DIAGNOSIS — K0889 Other specified disorders of teeth and supporting structures: Secondary | ICD-10-CM | POA: Insufficient documentation

## 2017-05-04 DIAGNOSIS — O26891 Other specified pregnancy related conditions, first trimester: Secondary | ICD-10-CM | POA: Insufficient documentation

## 2017-05-04 MED ORDER — AMOXICILLIN-POT CLAVULANATE 875-125 MG PO TABS
1.0000 | ORAL_TABLET | Freq: Two times a day (BID) | ORAL | Status: DC
  Administered 2017-05-04: 1 via ORAL
  Filled 2017-05-04 (×2): qty 1

## 2017-05-04 MED ORDER — ONDANSETRON HCL 4 MG PO TABS: 4.0000 mg | ORAL_TABLET | Freq: Four times a day (QID) | ORAL | 0 refills | Status: DC

## 2017-05-04 MED ORDER — OXYCODONE-ACETAMINOPHEN 5-325 MG PO TABS
1.0000 | ORAL_TABLET | Freq: Once | ORAL | Status: AC
  Administered 2017-05-04: 1 via ORAL
  Filled 2017-05-04: qty 1

## 2017-05-04 MED ORDER — AMOXICILLIN-POT CLAVULANATE 875-125 MG PO TABS: 1.0000 | ORAL_TABLET | Freq: Two times a day (BID) | ORAL | 0 refills | Status: AC

## 2017-05-04 MED ORDER — ONDANSETRON 8 MG PO TBDP
8.0000 mg | ORAL_TABLET | Freq: Once | ORAL | Status: AC
  Administered 2017-05-04: 8 mg via ORAL
  Filled 2017-05-04: qty 1

## 2017-05-04 MED ORDER — OXYCODONE-ACETAMINOPHEN 5-325 MG PO TABS: 1.0000 | ORAL_TABLET | ORAL | 0 refills | Status: DC | PRN

## 2017-05-04 NOTE — Progress Notes (Signed)
Written and verbal d/c instructions given and understanding voiced. 

## 2017-05-04 NOTE — MAU Note (Signed)
Vanessa Macdonald CNM in to see pt in Triage and discuss plan of care. Pt voices understanding and agrees with plan

## 2017-05-04 NOTE — MAU Provider Note (Signed)
History   G3P1011 @ 5-6 weeks in with tooth pain for over 6 days. States has appointment next week at dentist but could not stand pain any longer. Pain is located right upper bact of gum line.   CSN: [id number]  Arrival date & time 05/04/17  1914   None     Chief Complaint  Patient presents with  . Facial Pain    HPI  Past Medical History:  Diagnosis Date  . Asthma     Past Surgical History:  Procedure Laterality Date  . NO PAST SURGERIES      No family history on file.  Social History   Tobacco Use  . Smoking status: Never Smoker  . Smokeless tobacco: Never Used  Substance Use Topics  . Alcohol use: No  . Drug use: No    OB History    Gravida Para Term Preterm AB Living   3 1 1   1 1    SAB TAB Ectopic Multiple Live Births   1       1      Review of Systems  Constitutional: Negative.   HENT: Positive for dental problem and facial swelling.   Eyes: Negative.   Respiratory: Negative.   Cardiovascular: Negative.   Gastrointestinal: Positive for nausea.  Endocrine: Negative.   Genitourinary: Negative.   Musculoskeletal: Negative.   Skin: Negative.   Allergic/Immunologic: Negative.   Neurological: Negative.   Hematological: Negative.   Psychiatric/Behavioral: Negative.     Allergies  Patient has no known allergies.  Home Medications   Current Outpatient Rx  . Order #: [id number] Class: Print  . Order #: [id number] Class: Print  . Order #: [id number] Class: Print    BP 101/71   Pulse 80   Temp 98.2 F (36.8 C)   Resp 18   Ht 5' (1.524 m)   Wt 115 lb (52.2 kg)   LMP 03/19/2017   SpO2 100%   BMI 22.46 kg/m   Physical Exam  Constitutional: She is oriented to person, place, and time. She appears well-developed and well-nourished.  HENT:  Head: Normocephalic.  Musculoskeletal: Normal range of motion.  Neurological: She is alert and oriented to person, place, and time. She has normal reflexes.  Skin: Skin is warm and dry.  Psychiatric: She has  a normal mood and affect. Her behavior is normal. Judgment and thought content normal.    MAU Course  Procedures (including critical care time)  Labs Reviewed - No data to display US Ob Transvaginal  Result Date: 05/03/2017 CLINICAL DATA:  Pregnancy of unknown location EXAM: TRANSVAGINAL OB ULTRASOUND TECHNIQUE: Transvaginal ultrasound was performed for complete evaluation of the gestation as well as the maternal uterus, adnexal regions, and pelvic cul-de-sac. COMPARISON:  04/29/2017 FINDINGS: Intrauterine gestational sac: Present Yolk sac:  Questionably visualized Embryo:  Not visualized Cardiac Activity: N/A Heart Rate: N/A bpm MSD: 7.1  mm   5 w   3  d Subchorionic hemorrhage:  None Maternal uterus/adnexae: LEFT ovary normal size and morphology, 2.8 x 2.6 x 1.9 cm. RIGHT ovary normal size and morphology, 1.5 x 3.2 x 1.2 cm. Trace free pelvic fluid. No adnexal masses. IMPRESSION: Gestational sac identified within the uterus containing a questionable yolk sac but no fetal pole is identified. Otherwise unremarkable exam. Electronically Signed   By: Ulyses Southward M.D.   On: 05/03/2017 14:17     1. Pain in a tooth or teeth       MDM  Impacted upper right wisdom tooth. Right side  of face swollen, inner upper area of back of gum red swollen and tender. VSS, will treat with augmentin, zofran and pain management until pt sees dentist or oral surgeon.

## 2017-05-04 NOTE — MAU Note (Addendum)
Pain R side of face and neck for 6 days. My upper wisdom tooth is bothering me. Have appt with dentist next wk. Cannot eat and having a lot of pain. Tylenol not helping

## 2017-05-09 ENCOUNTER — Ambulatory Visit (HOSPITAL_COMMUNITY): Admission: RE | Admit: 2017-05-09 | Payer: Medicaid Other | Source: Ambulatory Visit

## 2017-05-09 ENCOUNTER — Ambulatory Visit: Payer: Medicaid Other | Admitting: *Deleted

## 2017-05-09 ENCOUNTER — Ambulatory Visit (HOSPITAL_COMMUNITY)
Admission: RE | Admit: 2017-05-09 | Discharge: 2017-05-09 | Disposition: A | Discharge status: Home / Self Care | Payer: Medicaid Other | Source: Ambulatory Visit

## 2017-05-09 ENCOUNTER — Encounter: Payer: Self-pay | Admitting: *Deleted

## 2017-05-09 DIAGNOSIS — O3680X0 Pregnancy with inconclusive fetal viability, not applicable or unspecified: Secondary | ICD-10-CM

## 2017-05-09 DIAGNOSIS — Z3A01 Less than 8 weeks gestation of pregnancy: Secondary | ICD-10-CM | POA: Diagnosis not present

## 2017-05-09 DIAGNOSIS — O219 Vomiting of pregnancy, unspecified: Secondary | ICD-10-CM

## 2017-05-09 DIAGNOSIS — Z3491 Encounter for supervision of normal pregnancy, unspecified, first trimester: Secondary | ICD-10-CM | POA: Diagnosis not present

## 2017-05-09 DIAGNOSIS — Z3490 Encounter for supervision of normal pregnancy, unspecified, unspecified trimester: Secondary | ICD-10-CM | POA: Diagnosis present

## 2017-05-09 MED ORDER — METOCLOPRAMIDE HCL 10 MG PO TABS: 10.0000 mg | ORAL_TABLET | Freq: Three times a day (TID) | ORAL | 0 refills | Status: DC | PRN

## 2017-06-02 ENCOUNTER — Inpatient Hospital Stay (HOSPITAL_COMMUNITY)
Admission: AD | Admit: 2017-06-02 | Discharge: 2017-06-02 | Disposition: A | Discharge status: Home / Self Care | Payer: Medicaid Other | Source: Ambulatory Visit | Attending: Obstetrics & Gynecology | Admitting: Obstetrics & Gynecology

## 2017-06-02 ENCOUNTER — Encounter (HOSPITAL_COMMUNITY): Payer: Self-pay | Admitting: *Deleted

## 2017-06-02 DIAGNOSIS — R0981 Nasal congestion: Secondary | ICD-10-CM | POA: Diagnosis present

## 2017-06-02 DIAGNOSIS — O26899 Other specified pregnancy related conditions, unspecified trimester: Secondary | ICD-10-CM

## 2017-06-02 DIAGNOSIS — M545 Low back pain: Secondary | ICD-10-CM | POA: Diagnosis not present

## 2017-06-02 DIAGNOSIS — R109 Unspecified abdominal pain: Secondary | ICD-10-CM | POA: Diagnosis not present

## 2017-06-02 DIAGNOSIS — O26891 Other specified pregnancy related conditions, first trimester: Secondary | ICD-10-CM

## 2017-06-02 DIAGNOSIS — R102 Pelvic and perineal pain: Secondary | ICD-10-CM | POA: Insufficient documentation

## 2017-06-02 DIAGNOSIS — J069 Acute upper respiratory infection, unspecified: Secondary | ICD-10-CM

## 2017-06-02 DIAGNOSIS — Z3A10 10 weeks gestation of pregnancy: Secondary | ICD-10-CM | POA: Diagnosis not present

## 2017-06-02 DIAGNOSIS — O99511 Diseases of the respiratory system complicating pregnancy, first trimester: Secondary | ICD-10-CM | POA: Diagnosis not present

## 2017-06-02 LAB — URINALYSIS, ROUTINE W REFLEX MICROSCOPIC
Bilirubin Urine: NEGATIVE
GLUCOSE, UA: NEGATIVE mg/dL
Hgb urine dipstick: NEGATIVE
Ketones, ur: NEGATIVE mg/dL
Leukocytes, UA: NEGATIVE
Nitrite: NEGATIVE
PH: 6 (ref 5.0–8.0)
PROTEIN: NEGATIVE mg/dL
Specific Gravity, Urine: 1.019 (ref 1.005–1.030)

## 2017-06-02 LAB — WET PREP, GENITAL
Clue Cells Wet Prep HPF POC: NONE SEEN
Sperm: NONE SEEN
Trich, Wet Prep: NONE SEEN
Yeast Wet Prep HPF POC: NONE SEEN

## 2017-06-02 MED ORDER — SALINE SPRAY 0.65 % NA SOLN
1.0000 | NASAL | 0 refills | Status: DC | PRN
Start: 2017-06-02 — End: 2020-02-26

## 2017-06-02 MED ORDER — GUAIFENESIN ER 600 MG PO TB12: 600.0000 mg | ORAL_TABLET | Freq: Two times a day (BID) | ORAL | 1 refills | Status: DC

## 2017-06-02 MED ORDER — SALINE SPRAY 0.65 % NA SOLN: 1.0000 | NASAL | 0 refills | Status: DC | PRN

## 2017-06-02 NOTE — Discharge Instructions (Signed)
First Trimester of Pregnancy The first trimester of pregnancy is from week 1 until the end of week 13 (months 1 through 3). During this time, your baby will begin to develop inside you. At 6-8 weeks, the eyes and face are formed, and the heartbeat can be seen on ultrasound. At the end of 12 weeks, all the baby's organs are formed. Prenatal care is all the medical care you receive before the birth of your baby. Make sure you get good prenatal care and follow all of your doctor's instructions. Follow these instructions at home: Medicines  Take over-the-counter and prescription medicines only as told by your doctor. Some medicines are safe and some medicines are not safe during pregnancy.  Take a prenatal vitamin that contains at least 600 micrograms (mcg) of folic acid.  If you have trouble pooping (constipation), take medicine that will make your stool soft (stool softener) if your doctor approves. Eating and drinking  Eat regular, healthy meals.  Your doctor will tell you the amount of weight gain that is right for you.  Avoid raw meat and uncooked cheese.  If you feel sick to your stomach (nauseous) or throw up (vomit): ? Eat 4 or 5 small meals a day instead of 3 large meals. ? Try eating a few soda crackers. ? Drink liquids between meals instead of during meals.  To prevent constipation: ? Eat foods that are high in fiber, like fresh fruits and vegetables, whole grains, and beans. ? Drink enough fluids to keep your pee (urine) clear or pale yellow. Activity  Exercise only as told by your doctor. Stop exercising if you have cramps or pain in your lower belly (abdomen) or low back.  Do not exercise if it is too hot, too humid, or if you are in a place of great height (high altitude).  Try to avoid standing for long periods of time. Move your legs often if you must stand in one place for a long time.  Avoid heavy lifting.  Wear low-heeled shoes. Sit and stand up straight.  You  can have sex unless your doctor tells you not to. Relieving pain and discomfort  Wear a good support bra if your breasts are sore.  Take warm water baths (sitz baths) to soothe pain or discomfort caused by hemorrhoids. Use hemorrhoid cream if your doctor says it is okay.  Rest with your legs raised if you have leg cramps or low back pain.  If you have puffy, bulging veins (varicose veins) in your legs: ? Wear support hose or compression stockings as told by your doctor. ? Raise (elevate) your feet for 15 minutes, 3-4 times a day. ? Limit salt in your food. Prenatal care  Schedule your prenatal visits by the twelfth week of pregnancy.  Write down your questions. Take them to your prenatal visits.  Keep all your prenatal visits as told by your doctor. This is important. Safety  Wear your seat belt at all times when driving.  Make a list of emergency phone numbers. The list should include numbers for family, friends, the hospital, and police and fire departments. General instructions  Ask your doctor for a referral to a local prenatal class. Begin classes no later than at the start of month 6 of your pregnancy.  Ask for help if you need counseling or if you need help with nutrition. Your doctor can give you advice or tell you where to go for help.  Do not use hot tubs, steam rooms, or  saunas.  Do not douche or use tampons or scented sanitary pads.  Do not cross your legs for long periods of time.  Avoid all herbs and alcohol. Avoid drugs that are not approved by your doctor.  Do not use any tobacco products, including cigarettes, chewing tobacco, and electronic cigarettes. If you need help quitting, ask your doctor. You may get counseling or other support to help you quit.  Avoid cat litter boxes and soil used by cats. These carry germs that can cause birth defects in the baby and can cause a loss of your baby (miscarriage) or stillbirth.  Visit your dentist. At home, brush  your teeth with a soft toothbrush. Be gentle when you floss. Contact a doctor if:  You are dizzy.  You have mild cramps or pressure in your lower belly.  You have a nagging pain in your belly area.  You continue to feel sick to your stomach, you throw up, or you have watery poop (diarrhea).  You have a bad smelling fluid coming from your vagina.  You have pain when you pee (urinate).  You have increased puffiness (swelling) in your face, hands, legs, or ankles. Get help right away if:  You have a fever.  You are leaking fluid from your vagina.  You have spotting or bleeding from your vagina.  You have very bad belly cramping or pain.  You gain or lose weight rapidly.  You throw up blood. It may look like coffee grounds.  You are around people who have [place] measles, fifth disease, or chickenpox.  You have a very bad headache.  You have shortness of breath.  You have any kind of trauma, such as from a fall or a car accident. Summary  The first trimester of pregnancy is from week 1 until the end of week 13 (months 1 through 3).  To take care of yourself and your unborn baby, you will need to eat healthy meals, take medicines only if your doctor tells you to do so, and do activities that are safe for you and your baby.  Keep all follow-up visits as told by your doctor. This is important as your doctor will have to ensure that your baby is healthy and growing well. This information is not intended to replace advice given to you by your health care provider. Make sure you discuss any questions you have with your health care provider. Document Released: 10/03/2007 Document Revised: 04/24/2016 Document Reviewed: 04/24/2016 Elsevier Interactive Patient Education  2017 Elsevier Inc. Upper Respiratory Infection, Adult Most upper respiratory infections (URIs) are caused by a virus. A URI affects the nose, throat, and upper air passages. The most common type of URI is often  called "the common cold." Follow these instructions at home:  Take medicines only as told by your doctor.  Gargle warm saltwater or take cough drops to comfort your throat as told by your doctor.  Use a warm mist humidifier or inhale steam from a shower to increase air moisture. This may make it easier to breathe.  Drink enough fluid to keep your pee (urine) clear or pale yellow.  Eat soups and other clear broths.  Have a healthy diet.  Rest as needed.  Go back to work when your fever is gone or your doctor says it is okay. ? You may need to stay home longer to avoid giving your URI to others. ? You can also wear a face mask and wash your hands often to prevent spread of  the virus.  Use your inhaler more if you have asthma.  Do not use any tobacco products, including cigarettes, chewing tobacco, or electronic cigarettes. If you need help quitting, ask your doctor. Contact a doctor if:  You are getting worse, not better.  Your symptoms are not helped by medicine.  You have chills.  You are getting more short of breath.  You have brown or red mucus.  You have yellow or brown discharge from your nose.  You have pain in your face, especially when you bend forward.  You have a fever.  You have puffy (swollen) neck glands.  You have pain while swallowing.  You have white areas in the back of your throat. Get help right away if:  You have very bad or constant: ? Headache. ? Ear pain. ? Pain in your forehead, behind your eyes, and over your cheekbones (sinus pain). ? Chest pain.  You have long-lasting (chronic) lung disease and any of the following: ? Wheezing. ? Long-lasting cough. ? Coughing up blood. ? A change in your usual mucus.  You have a stiff neck.  You have changes in your: ? Vision. ? Hearing. ? Thinking. ? Mood. This information is not intended to replace advice given to you by your health care provider. Make sure you discuss any questions you  have with your health care provider. Document Released: 10/03/2007 Document Revised: 12/18/2015 Document Reviewed: 07/22/2013 Elsevier Interactive Patient Education  2018 [employer]  Prenatal Care Providers [facility]    [facility]  [phone]     Phone: [phone]          [facility]                      [facility]  @[place]     Phone: [phone]  Phone: [phone]         [facility]     Phone: [phone]  Phone: [phone]           [facility] [place]                hone: [phone]  Phone: [phone]         [facility]                                   Phone: [phone]           [facility]                Phone: [phone]  [facility]   [address]Phone:[phone]    [facility] [place]          Phone: [phone] [facility]   Phone: [phone]

## 2017-06-02 NOTE — MAU Note (Signed)
Patient presents with nasal congestion, headache, abdominal pain and back pain.

## 2017-06-02 NOTE — MAU Provider Note (Signed)
Chief Complaint: Abdominal Pain; Back Pain; Nasal Congestion; and Headache   First Provider Initiated Contact with Patient 06/02/17 1717        SUBJECTIVE HPI: Vanessa Macdonald is a 22 y.o. G3P1011 at [redacted]w[redacted]d by LMP who presents to maternity admissions reporting nasal congestion, headache and lower abdominal cramping.  Does have constipation.  Some back pain also. She denies vaginal bleeding, vaginal itching/burning, urinary symptoms, dizziness, n/v, or fever/chills.    Was seen by family doctor for URI. Treated symptomatically   Abdominal Pain  This is a recurrent problem. The current episode started in the past 7 days. The onset quality is gradual. The problem occurs intermittently. The problem has been waxing and waning. The pain is located in the LLQ, RLQ and suprapubic region. The pain is moderate. The quality of the pain is cramping. The abdominal pain radiates to the back. Associated symptoms include constipation and headaches. Pertinent negatives include no diarrhea, dysuria, fever, myalgias, nausea or vomiting. The pain is aggravated by palpation. The pain is relieved by nothing. She has tried nothing for the symptoms.  Back Pain  This is a recurrent problem. The current episode started in the past 7 days. The problem occurs intermittently. The problem has been waxing and waning since onset. The pain is present in the lumbar spine. Associated symptoms include abdominal pain and headaches. Pertinent negatives include no dysuria or fever. She has tried nothing for the symptoms.  Headache   This is a new problem. The current episode started in the past 7 days. The pain radiates to the face. The quality of the pain is described as aching. Associated symptoms include abdominal pain, back pain and sinus pressure. Pertinent negatives include no blurred vision, fever, nausea, photophobia, sore throat, visual change or vomiting. She has tried acetaminophen (sudafed) for the symptoms.    RN Note: Patient  presents with nasal congestion, headache, abdominal pain and back pain.     Past Medical History:  Diagnosis Date  . Asthma    Past Surgical History:  Procedure Laterality Date  . NO PAST SURGERIES     Social History   Socioeconomic History  . Marital status: Married    Spouse name: Not on file  . Number of children: Not on file  . Years of education: Not on file  . Highest education level: Not on file  Social Needs  . Financial resource strain: Not on file  . Food insecurity - worry: Not on file  . Food insecurity - inability: Not on file  . Transportation needs - medical: Not on file  . Transportation needs - non-medical: Not on file  Occupational History  . Not on file  Tobacco Use  . Smoking status: Never Smoker  . Smokeless tobacco: Never Used  Substance and Sexual Activity  . Alcohol use: No  . Drug use: No  . Sexual activity: Yes    Birth control/protection: None  Other Topics Concern  . Not on file  Social History Narrative  . Not on file   No current facility-administered medications on file prior to encounter.    Current Outpatient Medications on File Prior to Encounter  Medication Sig Dispense Refill  . metoCLOPramide (REGLAN) 10 MG tablet Take 1 tablet (10 mg total) by mouth every 8 (eight) hours as needed for nausea. 90 tablet 0  . oxyCODONE-acetaminophen (ROXICET) 5-325 MG tablet Take 1 tablet by mouth every 4 (four) hours as needed for severe pain. 30 tablet 0  . Prenatal Vit-Fe  Fumarate-FA (MULTIVITAMIN-PRENATAL) 27-0.8 MG TABS tablet Take 1 tablet by mouth daily at 12 noon. 30 each 9   No Known Allergies  I have reviewed patient's Past Medical Hx, Surgical Hx, Family Hx, Social Hx, medications and allergies.   ROS:  Review of Systems  Constitutional: Negative for fever.  HENT: Positive for sinus pressure. Negative for sore throat.   Eyes: Negative for blurred vision and photophobia.  Gastrointestinal: Positive for abdominal pain and  constipation. Negative for diarrhea, nausea and vomiting.  Genitourinary: Negative for dysuria.  Musculoskeletal: Positive for back pain. Negative for myalgias.  Neurological: Positive for headaches.   Review of Systems  Other systems negative   Physical Exam  Physical Exam Patient Vitals for the past 24 hrs:  BP Temp Temp src Pulse Resp Height Weight  06/02/17 1642 111/69 98.8 F (37.1 C) Oral (!) 105 17 5' (1.524 m) 112 lb (50.8 kg)   Constitutional: Well-developed, well-nourished female in no acute distress.  Cardiovascular: normal rate Respiratory: normal effort GI: Abd soft, non-tender. Pos BS x 4 MS: Extremities nontender, no edema, normal ROM Neurologic: Alert and oriented x 4.  GU: Neg CVAT.  PELVIC EXAM: Cervix pink, visually closed, without lesion, scant white creamy discharge, vaginal walls and external genitalia normal Bimanual exam: Cervix 0/long/high, firm, anterior, neg CMT, uterus nontender, nonenlarged, adnexa without tenderness, enlargement, or mass  FHT 160 by bedside US  LAB RESULTS Results for orders placed or performed during the hospital encounter of 06/02/17 (from the past 24 hour(s))  Urinalysis, Routine w reflex microscopic     Status: Abnormal   Collection Time: 06/02/17  4:40 PM  Result Value Ref Range   Color, Urine YELLOW YELLOW   APPearance CLOUDY (A) CLEAR   Specific Gravity, Urine 1.019 1.005 - 1.030   pH 6.0 5.0 - 8.0   Glucose, UA NEGATIVE NEGATIVE mg/dL   Hgb urine dipstick NEGATIVE NEGATIVE   Bilirubin Urine NEGATIVE NEGATIVE   Ketones, ur NEGATIVE NEGATIVE mg/dL   Protein, ur NEGATIVE NEGATIVE mg/dL   Nitrite NEGATIVE NEGATIVE   Leukocytes, UA NEGATIVE NEGATIVE  Wet prep, genital     Status: Abnormal   Collection Time: 06/02/17  5:25 PM  Result Value Ref Range   Yeast Wet Prep HPF POC NONE SEEN NONE SEEN   Trich, Wet Prep NONE SEEN NONE SEEN   Clue Cells Wet Prep HPF POC NONE SEEN NONE SEEN   WBC, Wet Prep HPF POC FEW (A) NONE  SEEN   Sperm NONE SEEN        IMAGING US Ob Transvaginal  Result Date: 05/09/2017 CLINICAL DATA:  Assess viability. Left pelvic pain for 2 days. By LMP of 03/19/2017, patient is 7 weeks 2 days. EDC by LMP is 12/24/2017. EXAM: TRANSVAGINAL OB ULTRASOUND TECHNIQUE: Transvaginal ultrasound was performed for complete evaluation of the gestation as well as the maternal uterus, adnexal regions, and pelvic cul-de-sac. COMPARISON:  05/03/2017 FINDINGS: Intrauterine gestational sac: Present Yolk sac:  Present Embryo:  Present Cardiac Activity: Present Heart Rate: 101 bpm CRL:   3.1 mm   5 w 6 d                  [place] EDC: 01/03/2018 Subchorionic hemorrhage:  None visualized. Maternal uterus/adnexae: Normal appearance of both ovaries. Trace free pelvic fluid. IMPRESSION: 1. Single living intrauterine embryo measuring 5 weeks 6 days by ultrasound. 2. Dating by ultrasound differs compared with clinical dating. By today's exam, EDC is 01/03/2018. Electronically Signed   By: Lanora Manis  Manson PasseyBrown M.D.   On: 05/09/2017 13:47    MAU Management/MDM: Pelvic exam and cultures done Will check bedside Ultrasound to verify viability  This bleeding/pain can represent a normal pregnancy with bleeding, spontaneous abortion or even an ectopic which can be life-threatening.  The process as listed above helps to determine which of these is present.  Discussed URI care  ASSESSMENT SIUP at [id number] Pelvic pain Low back pain Upper respiratory infection, no evidence for flu  PLAN Discharge home Rx Mucinex for URI Rx Ocean spray for URI Given list of OB providers [facility] rooms [facility] too [facility]" Pt stable at time of discharge. Encouraged to return here or to other Urgent Care/ED if she develops worsening of symptoms, increase in pain, fever, or other concerning symptoms.    Wynelle BourgeoisMarie Chauncey Sciulli CNM, MSN Certified Nurse-Midwife 06/02/2017  5:17 PM

## 2017-06-03 LAB — GC/CHLAMYDIA PROBE AMP (~~LOC~~) NOT AT ARMC
Chlamydia: NEGATIVE
Neisseria Gonorrhea: NEGATIVE

## 2017-08-05 ENCOUNTER — Encounter (HOSPITAL_COMMUNITY): Payer: Self-pay | Admitting: *Deleted

## 2017-08-05 ENCOUNTER — Inpatient Hospital Stay (HOSPITAL_COMMUNITY)
Admission: AD | Admit: 2017-08-05 | Discharge: 2017-08-05 | Disposition: A | Discharge status: Home / Self Care | Payer: Medicaid Other | Source: Ambulatory Visit | Attending: Obstetrics & Gynecology | Admitting: Obstetrics & Gynecology

## 2017-08-05 ENCOUNTER — Other Ambulatory Visit: Payer: Self-pay

## 2017-08-05 DIAGNOSIS — O26812 Pregnancy related exhaustion and fatigue, second trimester: Secondary | ICD-10-CM | POA: Diagnosis not present

## 2017-08-05 DIAGNOSIS — R0981 Nasal congestion: Secondary | ICD-10-CM | POA: Insufficient documentation

## 2017-08-05 DIAGNOSIS — O26852 Spotting complicating pregnancy, second trimester: Secondary | ICD-10-CM | POA: Diagnosis not present

## 2017-08-05 DIAGNOSIS — O0932 Supervision of pregnancy with insufficient antenatal care, second trimester: Secondary | ICD-10-CM | POA: Insufficient documentation

## 2017-08-05 DIAGNOSIS — Z3A20 20 weeks gestation of pregnancy: Secondary | ICD-10-CM | POA: Diagnosis not present

## 2017-08-05 DIAGNOSIS — R109 Unspecified abdominal pain: Secondary | ICD-10-CM | POA: Diagnosis present

## 2017-08-05 LAB — URINALYSIS, ROUTINE W REFLEX MICROSCOPIC
BILIRUBIN URINE: NEGATIVE
Glucose, UA: NEGATIVE mg/dL
Hgb urine dipstick: NEGATIVE
KETONES UR: NEGATIVE mg/dL
Leukocytes, UA: NEGATIVE
Nitrite: NEGATIVE
PH: 6 (ref 5.0–8.0)
PROTEIN: NEGATIVE mg/dL
Specific Gravity, Urine: 1.018 (ref 1.005–1.030)

## 2017-08-05 LAB — CBC
HCT: 31.4 % — ABNORMAL LOW (ref 36.0–46.0)
Hemoglobin: 11.3 g/dL — ABNORMAL LOW (ref 12.0–15.0)
MCH: 30.6 pg (ref 26.0–34.0)
MCHC: 36 g/dL (ref 30.0–36.0)
MCV: 85.1 fL (ref 78.0–100.0)
PLATELETS: 188 10*3/uL (ref 150–400)
RBC: 3.69 MIL/uL — ABNORMAL LOW (ref 3.87–5.11)
RDW: 14.4 % (ref 11.5–15.5)
WBC: 9.6 10*3/uL (ref 4.0–10.5)

## 2017-08-05 LAB — COMPREHENSIVE METABOLIC PANEL
ALBUMIN: 3.2 g/dL — AB (ref 3.5–5.0)
ALT: 18 U/L (ref 14–54)
ANION GAP: 7 (ref 5–15)
AST: 17 U/L (ref 15–41)
Alkaline Phosphatase: 52 U/L (ref 38–126)
BUN: 9 mg/dL (ref 6–20)
CALCIUM: 9 mg/dL (ref 8.9–10.3)
CHLORIDE: 106 mmol/L (ref 101–111)
CO2: 21 mmol/L — AB (ref 22–32)
Creatinine, Ser: 0.4 mg/dL — ABNORMAL LOW (ref 0.44–1.00)
GFR calc Af Amer: >60 mL/min (ref >60)
GFR calc non Af Amer: >60 mL/min (ref >60)
Glucose, Bld: 70 mg/dL (ref 65–99)
POTASSIUM: 4 mmol/L (ref 3.5–5.1)
SODIUM: 134 mmol/L — AB (ref 135–145)
Total Bilirubin: 0.2 mg/dL — ABNORMAL LOW (ref 0.3–1.2)
Total Protein: 6.8 g/dL (ref 6.5–8.1)

## 2017-08-05 MED ORDER — FERROUS SULFATE 325 (65 FE) MG PO TABS: 325.0000 mg | ORAL_TABLET | Freq: Every day | ORAL | 3 refills | Status: DC

## 2017-08-05 MED ORDER — PSEUDOEPHEDRINE HCL 30 MG PO TABS: 30.0000 mg | ORAL_TABLET | ORAL | 0 refills | Status: DC | PRN

## 2017-08-05 MED ORDER — FERROUS SULFATE 325 (65 FE) MG PO TABS: 325.0000 mg | ORAL_TABLET | Freq: Every day | ORAL | 1 refills | Status: DC

## 2017-08-05 NOTE — MAU Note (Signed)
Pt went to [facility] in [place] and waited for 4-5 hrs and left to come here. She c/o sharp, lower abd cramping for the past 2 days that has worsened today. She says she started spotting last night only. She is also worried about dizziness, headaches, and feeling like she will pass out; these symptoms  have been going on for 2 months.

## 2017-08-05 NOTE — MAU Provider Note (Signed)
18 weeks Limited OB US wnl Cbc, cmp wnl EKG wnl  F/u with prenatal care next week in [facility]  Chief Complaint: Abdominal Pain   First Provider Initiated Contact with Patient 08/05/17 2132      SUBJECTIVE HPI: Vanessa Macdonald is a 22 y.o. G3P1011 at 21w1dby LMP who presents to maternity admissions reporting multiple symptoms including abdominal cramping, dizziness, headaches, feeling like passing out, nasal congestion, all symptoms starting 2 months ago.  She also had spotting x 1 episode yesterday only, none today.  [facility]. Her symptoms are intermittent, all occurring 2-3 times per week but not daily.  She feels she is eating and drinking enough fluids.  She has not tried any treatments. There are no other associated symptoms. She denies bright red vaginal bleeding, vaginal itching/burning, urinary symptoms, or fever/chills.     HPI  Past Medical History:  Diagnosis Date  . Asthma    Past Surgical History:  Procedure Laterality Date  . NO PAST SURGERIES     Social History   Socioeconomic History  . Marital status: Married    Spouse name: Not on file  . Number of children: Not on file  . Years of education: Not on file  . Highest education level: Not on file  Occupational History  . Not on file  Social Needs  . Financial resource strain: Not on file  . Food insecurity:    Worry: Not on file    Inability: Not on file  . Transportation needs:    Medical: Not on file    Non-medical: Not on file  Tobacco Use  . Smoking status: Never Smoker  . Smokeless tobacco: Never Used  Substance and Sexual Activity  . Alcohol use: No  . Drug use: No  . Sexual activity: Yes    Birth control/protection: None    Comment: last sex 30 July 2017  Lifestyle  . Physical activity:    Days per week: Not on file    Minutes per session: Not  on file  . Stress: Not on file  Relationships  . Social connections:    Talks on phone: Not on file    Gets together: Not on file    Attends religious service: Not on file    Active member of club or organization: Not on file    Attends meetings of clubs or organizations: Not on file    Relationship status: Not on file  . Intimate partner violence:    Fear of current or ex partner: Not on file    Emotionally abused: Not on file    Physically abused: Not on file    Forced sexual activity: Not on file  Other Topics Concern  . Not on file  Social History Narrative  . Not on file   No current facility-administered medications on file prior to encounter.    Current Outpatient Medications on File Prior to Encounter  Medication Sig Dispense Refill  . metoCLOPramide (REGLAN) 10 MG tablet Take 1 tablet (10 mg total) by mouth every 8 (eight) hours as needed for nausea. 90 tablet 0  . Prenatal Vit-Fe Fumarate-FA (MULTIVITAMIN-PRENATAL) 27-0.8 MG TABS tablet Take 1 tablet by mouth daily at 12 noon. 30 each 9  . sodium chloride (OCEAN) 0.65 % SOLN nasal spray Place 1 spray into both nostrils as  needed for congestion. 1 Bottle 0   No Known Allergies  ROS:  Review of Systems  Constitutional: Negative for chills, fatigue and fever.  Respiratory: Negative for shortness of breath.   Cardiovascular: Negative for chest pain.  Gastrointestinal: Positive for abdominal pain.  Genitourinary: Negative for difficulty urinating, dysuria, flank pain, pelvic pain, vaginal bleeding, vaginal discharge and vaginal pain.  Neurological: Positive for dizziness, light-headedness and headaches.  Psychiatric/Behavioral: Negative.      I have reviewed patient's Past Medical Hx, Surgical Hx, Family Hx, Social Hx, medications and allergies.   Physical Exam   BP 96/66 (BP Location: Right Arm)   Pulse 86   Temp 98.4 F (36.9 C) (Oral)   Resp 16   Ht 5' (1.524 m)   Wt 120 lb (54.4 kg)   LMP 03/19/2017    SpO2 100%   BMI 23.44 kg/m    Constitutional: Well-developed, well-nourished female in no acute distress.  HEART: normal rate, heart sounds, regular rhythm RESP: normal effort, lung sounds clear and equal bilaterally GI: Abd soft, non-tender. Pos BS x 4 MS: Extremities nontender, no edema, normal ROM Neurologic: Alert and oriented x 4.  GU: Neg CVAT.  PELVIC EXAM: Deferred  FHT 146 by bedside US  EKG with NSR  LAB RESULTS     Results for orders placed or performed during the hospital encounter of 08/05/17 (from the past 48 hour(s))  CBC     Status: Abnormal   Collection Time: 08/05/17  9:06 PM  Result Value Ref Range   WBC 9.6 4.0 - 10.5 K/uL   RBC 3.69 (L) 3.87 - 5.11 MIL/uL   Hemoglobin 11.3 (L) 12.0 - 15.0 g/dL   HCT 31.4 (L) 36.0 - 46.0 %   MCV 85.1 78.0 - 100.0 fL   MCH 30.6 26.0 - 34.0 pg   MCHC 36.0 30.0 - 36.0 g/dL   RDW 14.4 11.5 - 15.5 %   Platelets 188 150 - 400 K/uL    Comment: Performed at [facility], [address], [place], [place] [address]  Comprehensive metabolic panel     Status: Abnormal   Collection Time: 08/05/17  9:06 PM  Result Value Ref Range   Sodium 134 (L) 135 - 145 mmol/L   Potassium 4.0 3.5 - 5.1 mmol/L   Chloride 106 101 - 111 mmol/L   CO2 21 (L) 22 - 32 mmol/L   Glucose, Bld 70 65 - 99 mg/dL   BUN 9 6 - 20 mg/dL   Creatinine, Ser 0.40 (L) 0.44 - 1.00 mg/dL   Calcium 9.0 8.9 - 10.3 mg/dL   Total Protein 6.8 6.5 - 8.1 g/dL   Albumin 3.2 (L) 3.5 - 5.0 g/dL   AST 17 15 - 41 U/L   ALT 18 14 - 54 U/L   Alkaline Phosphatase 52 38 - 126 U/L   Total Bilirubin 0.2 (L) 0.3 - 1.2 mg/dL   GFR calc non Af Amer >60 >60 mL/min   GFR calc Af Amer >60 >60 mL/min    Comment: (NOTE) The eGFR has been calculated using the CKD EPI equation. This calculation has not been validated in all clinical situations. eGFR's persistently <60 mL/min signify possible Chronic Kidney Disease.    Anion gap 7 5 - 15    Comment: Performed at [facility], [address], [place],  [address]    IMAGING  Limited [place] Date: 08/05/17 EDD : 12/24/17 based on LMP Viability:  FHT detected CRL measurement c/w LMP dates  Fetal movement noted, amniotic fluid subjectively wnl  MAU Management/MDM: Ordered labs and reviewed results.  CBC, CMP wnl.  EKG NSR.  Bedside US done and pt and s/o reassured by images, size measurements which are c/w LMP dates.  Pt has new OB visit scheduled next week.  Saline nasal spray and occasional use of Sudafed for congestion.  Try antihistamines PRN.  D/C home with preterm labor/second trimester precautions. Pt to f/u with prenatal care as scheduled.  Pt discharged with strict return precautions.  ASSESSMENT 1. Late prenatal care affecting pregnancy in second trimester   2. Pregnancy related fatigue in second trimester   3. Spotting affecting pregnancy in second trimester   4. Mild nasal congestion     PLAN Discharge home Allergies as of 08/05/2017   No Known Allergies     Medication List    STOP taking these medications   guaiFENesin 600 MG 12 hr tablet Commonly known as:  MUCINEX   oxyCODONE-acetaminophen 5-325 MG tablet Commonly known as:  ROXICET     TAKE these medications   ferrous sulfate 325 (65 FE) MG tablet Take 1 tablet (325 mg total) by mouth daily with breakfast.   metoCLOPramide 10 MG tablet Commonly known as:  REGLAN Take 1 tablet (10 mg total) by mouth every 8 (eight) hours as needed for nausea.   multivitamin-prenatal 27-0.8 MG Tabs tablet Take 1 tablet by mouth daily at 12 noon.   pseudoephedrine 30 MG tablet Commonly known as:  SUDAFED Take 1 tablet (30 mg total) by mouth every 4 (four) hours as needed for congestion.   sodium chloride 0.65 % Soln nasal spray Commonly known as:  OCEAN Place 1 spray into both nostrils as needed for congestion.      Follow-up Information    You prenatal provider in [facility] Follow up.   Why:  Return to [facility] or  MAU for emergencies          Fatima Blank Certified Nurse-Midwife 08/07/2017  10:43 AM

## 2017-10-08 ENCOUNTER — Inpatient Hospital Stay (HOSPITAL_COMMUNITY)
Admission: AD | Admit: 2017-10-08 | Discharge: 2017-10-08 | Disposition: A | Discharge status: Home / Self Care | Payer: Medicaid Other | Source: Ambulatory Visit | Attending: Obstetrics & Gynecology | Admitting: Obstetrics & Gynecology

## 2017-10-08 ENCOUNTER — Encounter (HOSPITAL_COMMUNITY): Payer: Self-pay | Admitting: *Deleted

## 2017-10-08 DIAGNOSIS — Z3A29 29 weeks gestation of pregnancy: Secondary | ICD-10-CM | POA: Diagnosis not present

## 2017-10-08 DIAGNOSIS — D509 Iron deficiency anemia, unspecified: Secondary | ICD-10-CM

## 2017-10-08 DIAGNOSIS — E876 Hypokalemia: Secondary | ICD-10-CM | POA: Diagnosis not present

## 2017-10-08 DIAGNOSIS — O219 Vomiting of pregnancy, unspecified: Secondary | ICD-10-CM

## 2017-10-08 DIAGNOSIS — O99283 Endocrine, nutritional and metabolic diseases complicating pregnancy, third trimester: Secondary | ICD-10-CM | POA: Insufficient documentation

## 2017-10-08 DIAGNOSIS — R42 Dizziness and giddiness: Secondary | ICD-10-CM | POA: Diagnosis not present

## 2017-10-08 DIAGNOSIS — O26893 Other specified pregnancy related conditions, third trimester: Secondary | ICD-10-CM | POA: Diagnosis not present

## 2017-10-08 DIAGNOSIS — O99013 Anemia complicating pregnancy, third trimester: Secondary | ICD-10-CM | POA: Diagnosis not present

## 2017-10-08 DIAGNOSIS — R109 Unspecified abdominal pain: Secondary | ICD-10-CM

## 2017-10-08 DIAGNOSIS — O26813 Pregnancy related exhaustion and fatigue, third trimester: Secondary | ICD-10-CM

## 2017-10-08 LAB — WET PREP, GENITAL
Clue Cells Wet Prep HPF POC: NONE SEEN
Sperm: NONE SEEN
Trich, Wet Prep: NONE SEEN
Yeast Wet Prep HPF POC: NONE SEEN

## 2017-10-08 LAB — COMPREHENSIVE METABOLIC PANEL
ALT: 16 U/L (ref 14–54)
AST: 24 U/L (ref 15–41)
Albumin: 2.8 g/dL — ABNORMAL LOW (ref 3.5–5.0)
Alkaline Phosphatase: 62 U/L (ref 38–126)
Anion gap: 9 (ref 5–15)
BUN: 6 mg/dL (ref 6–20)
CO2: 21 mmol/L — ABNORMAL LOW (ref 22–32)
Calcium: 8.2 mg/dL — ABNORMAL LOW (ref 8.9–10.3)
Chloride: 108 mmol/L (ref 101–111)
Creatinine, Ser: 0.46 mg/dL (ref 0.44–1.00)
GFR calc Af Amer: >60 mL/min (ref >60)
GFR calc non Af Amer: >60 mL/min (ref >60)
Glucose, Bld: 92 mg/dL (ref 65–99)
Potassium: 3 mmol/L — ABNORMAL LOW (ref 3.5–5.1)
Sodium: 138 mmol/L (ref 135–145)
Total Bilirubin: 0.5 mg/dL (ref 0.3–1.2)
Total Protein: 6.3 g/dL — ABNORMAL LOW (ref 6.5–8.1)

## 2017-10-08 LAB — URINALYSIS, ROUTINE W REFLEX MICROSCOPIC
Bilirubin Urine: NEGATIVE
Glucose, UA: NEGATIVE mg/dL
Hgb urine dipstick: NEGATIVE
Ketones, ur: NEGATIVE mg/dL
Nitrite: NEGATIVE
Protein, ur: NEGATIVE mg/dL
Specific Gravity, Urine: 1.018 (ref 1.005–1.030)
pH: 7 (ref 5.0–8.0)

## 2017-10-08 LAB — CBC
HCT: 24.9 % — ABNORMAL LOW (ref 36.0–46.0)
Hemoglobin: 8.8 g/dL — ABNORMAL LOW (ref 12.0–15.0)
MCH: 29.4 pg (ref 26.0–34.0)
MCHC: 35.3 g/dL (ref 30.0–36.0)
MCV: 83.3 fL (ref 78.0–100.0)
Platelets: 194 10*3/uL (ref 150–400)
RBC: 2.99 MIL/uL — ABNORMAL LOW (ref 3.87–5.11)
RDW: 13.4 % (ref 11.5–15.5)
WBC: 7.9 10*3/uL (ref 4.0–10.5)

## 2017-10-08 LAB — PROTEIN / CREATININE RATIO, URINE
Creatinine, Urine: 149 mg/dL
Protein Creatinine Ratio: 0.09 mg/mg{Cre} (ref 0.00–0.15)
Total Protein, Urine: 13 mg/dL

## 2017-10-08 MED ORDER — ONDANSETRON 8 MG PO TBDP
8.0000 mg | ORAL_TABLET | Freq: Once | ORAL | Status: AC
  Administered 2017-10-08: 8 mg via ORAL
  Filled 2017-10-08: qty 1

## 2017-10-08 MED ORDER — POTASSIUM CHLORIDE ER 10 MEQ PO TBCR: 10.0000 meq | EXTENDED_RELEASE_TABLET | Freq: Every day | ORAL | 0 refills | Status: DC

## 2017-10-08 MED ORDER — FERROUS SULFATE 325 (65 FE) MG PO TABS: 325.0000 mg | ORAL_TABLET | Freq: Three times a day (TID) | ORAL | 3 refills | Status: DC

## 2017-10-08 MED ORDER — ACETAMINOPHEN 500 MG PO TABS
1000.0000 mg | ORAL_TABLET | Freq: Once | ORAL | Status: AC
  Administered 2017-10-08: 1000 mg via ORAL
  Filled 2017-10-08: qty 2

## 2017-10-08 NOTE — Discharge Instructions (Signed)

## 2017-10-08 NOTE — MAU Note (Signed)
Abdominal pain for a couple of days, nausea and vomiting X 3 today, dizziness, blurry vision, fatigue. Shortness of breath and hard stool. Received care at [facility]

## 2017-10-08 NOTE — Progress Notes (Signed)
Pt refuses blood work.

## 2017-10-08 NOTE — MAU Provider Note (Signed)
Chief Complaint:  Abdominal Pain and Nausea   First Provider Initiated Contact with Patient 10/08/17 2210      HPI: Vanessa Macdonald is a 22 y.o. G3P1011 at 75w0dwho presents to maternity admissions reporting abdominal pain, n/v, dizziness, fatigue, and vision changes. She reports abdominal pain has been occurring for the past three days- she reports lower abdominal cramping that is intermittent, rates abdominal pain 2/10- has not taken any medication for abdominal pain. She reports that this has been occurring throughout the pregnancy along with the other symptoms of dizziness, fatigue and blurred vision. She reports symptoms occurs when she is up moving around and all of a sudden she feels "like she is going to pass out and the symptoms occur". She reports having blood work done in the office and not being anemic or on any medication for anemia. She reports n/v that just started three days ago, she reports vomiting 3 times today. She denies difficulty being able to keep food or liquids down. Reports mostly vomiting in the morning. She reports good fetal movement, denies LOF, vaginal bleeding, vaginal itching/burning, urinary symptoms, h/a, or fever/chills. She receives [facility] in [place].   Past Medical History: Past Medical History:  Diagnosis Date  . Asthma     Past obstetric history: OB History  Gravida Para Term Preterm AB Living  3 1 1   1 1   SAB TAB Ectopic Multiple Live Births  1       1    # Outcome Date GA Lbr Len/2nd Weight Sex Delivery Anes PTL Lv  3 Current           2 Term 2014     Vag-Spont   LIV  1 SAB             Past Surgical History: Past Surgical History:  Procedure Laterality Date  . NO PAST SURGERIES      Family History: History reviewed. No pertinent family history.  Social History: Social History   Tobacco Use  . Smoking status: Never Smoker  . Smokeless tobacco: Never Used  Substance Use Topics  . Alcohol use: No  . Drug use:  No    Allergies: No Known Allergies  Meds:  Medications Prior to Admission  Medication Sig Dispense Refill Last Dose  . metoCLOPramide (REGLAN) 10 MG tablet Take 1 tablet (10 mg total) by mouth every 8 (eight) hours as needed for nausea. 90 tablet 0 Past Month at Unknown time  . Prenatal Vit-Fe Fumarate-FA (MULTIVITAMIN-PRENATAL) 27-0.8 MG TABS tablet Take 1 tablet by mouth daily at 12 noon. 30 each 9 08/05/2017 at Unknown time  . pseudoephedrine (SUDAFED) 30 MG tablet Take 1 tablet (30 mg total) by mouth every 4 (four) hours as needed for congestion. 30 tablet 0   . sodium chloride (OCEAN) 0.65 % SOLN nasal spray Place 1 spray into both nostrils as needed for congestion. 1 Bottle 0 Past Month at Unknown time  . [DISCONTINUED] ferrous sulfate 325 (65 FE) MG tablet Take 1 tablet (325 mg total) by mouth daily with breakfast. 30 tablet 3     ROS:  Review of Systems  Constitutional: Positive for fatigue. Negative for chills and fever.  Eyes: Positive for visual disturbance.  Respiratory: Negative.   Cardiovascular: Negative.   Gastrointestinal: Positive for abdominal pain, nausea and vomiting. Negative for constipation and diarrhea.  Genitourinary: Negative.   Neurological: Positive for dizziness and light-headedness. Negative for seizures, syncope and headaches.  I have reviewed patient's Past Medical Hx, Surgical Hx, Family Hx, Social Hx, medications and allergies.   Physical Exam   Patient Vitals for the past 24 hrs:  BP Temp Temp src Pulse Resp SpO2 Weight  10/08/17 2326 (!) 106/58 98.5 F (36.9 C) Oral 79 16 - -  10/08/17 2135 - - - - - 100 % -  10/08/17 2126 101/66 98.1 F (36.7 C) Oral 91 18 - -  10/08/17 2119 - - - - - - 137 lb (62.1 kg)   Constitutional: Well-developed, well-nourished female in no acute distress.  Cardiovascular: normal rate Respiratory: normal effort GI: Abd soft, non-tender, gravid appropriate for gestational age.  Neurologic: Alert and oriented x 4.   GU: Neg CVAT.  PELVIC EXAM: blind swabs obtained CERVICAL EXAM:  Dilation: Closed Effacement (%): Thick Cervical Position: Posterior Exam by:: Lanice Shirts CNM   FHT:  Baseline 150 , moderate variability, accelerations present, no decelerations Contractions: none   Labs: Results for orders placed or performed during the hospital encounter of 10/08/17 (from the past 24 hour(s))  Urinalysis, Routine w reflex microscopic     Status: Abnormal   Collection Time: 10/08/17  9:16 PM  Result Value Ref Range   Color, Urine YELLOW YELLOW   APPearance CLOUDY (A) CLEAR   Specific Gravity, Urine 1.018 1.005 - 1.030   pH 7.0 5.0 - 8.0   Glucose, UA NEGATIVE NEGATIVE mg/dL   Hgb urine dipstick NEGATIVE NEGATIVE   Bilirubin Urine NEGATIVE NEGATIVE   Ketones, ur NEGATIVE NEGATIVE mg/dL   Protein, ur NEGATIVE NEGATIVE mg/dL   Nitrite NEGATIVE NEGATIVE   Leukocytes, UA TRACE (A) NEGATIVE   WBC, UA 0-5 0 - 5 WBC/hpf   Bacteria, UA RARE (A) NONE SEEN   Squamous Epithelial / LPF 0-5 0 - 5  Protein / creatinine ratio, urine     Status: None   Collection Time: 10/08/17  9:16 PM  Result Value Ref Range   Creatinine, Urine 149.00 mg/dL   Total Protein, Urine 13 mg/dL   Protein Creatinine Ratio 0.09 0.00 - 0.15 mg/mg[Cre]  CBC     Status: Abnormal   Collection Time: 10/08/17 10:39 PM  Result Value Ref Range   WBC 7.9 4.0 - 10.5 K/uL   RBC 2.99 (L) 3.87 - 5.11 MIL/uL   Hemoglobin 8.8 (L) 12.0 - 15.0 g/dL   HCT 16.1 (L) 09.6 - 04.5 %   MCV 83.3 78.0 - 100.0 fL   MCH 29.4 26.0 - 34.0 pg   MCHC 35.3 30.0 - 36.0 g/dL   RDW 40.9 81.1 - 91.4 %   Platelets 194 150 - 400 K/uL  Comprehensive metabolic panel     Status: Abnormal   Collection Time: 10/08/17 10:39 PM  Result Value Ref Range   Sodium 138 135 - 145 mmol/L   Potassium 3.0 (L) 3.5 - 5.1 mmol/L   Chloride 108 101 - 111 mmol/L   CO2 21 (L) 22 - 32 mmol/L   Glucose, Bld 92 65 - 99 mg/dL   BUN 6 6 - 20 mg/dL   Creatinine, Ser 7.82 0.44 - 1.00  mg/dL   Calcium 8.2 (L) 8.9 - 10.3 mg/dL   Total Protein 6.3 (L) 6.5 - 8.1 g/dL   Albumin 2.8 (L) 3.5 - 5.0 g/dL   AST 24 15 - 41 U/L   ALT 16 14 - 54 U/L   Alkaline Phosphatase 62 38 - 126 U/L   Total Bilirubin 0.5 0.3 - 1.2 mg/dL  GFR calc non Af Amer >60 >60 mL/min   GFR calc Af Amer >60 >60 mL/min   Anion gap 9 5 - 15  Wet prep, genital     Status: Abnormal   Collection Time: 10/08/17 10:45 PM  Result Value Ref Range   Yeast Wet Prep HPF POC NONE SEEN NONE SEEN   Trich, Wet Prep NONE SEEN NONE SEEN   Clue Cells Wet Prep HPF POC NONE SEEN NONE SEEN   WBC, Wet Prep HPF POC MANY (A) NONE SEEN   Sperm NONE SEEN     MAU Course/MDM: Orders Placed This Encounter  Procedures  . Wet prep, genital  . Urinalysis, Routine w reflex microscopic  . CBC  . Comprehensive metabolic panel  . Protein / creatinine ratio, urine  . Discharge patient Discharge disposition: 01-Home or Self Care; Discharge patient date: 10/08/2017   Wet prep- negative  GC/C- pending  CBC- showed decreased HGB of 8.8 CMP- showed decreased potassium, calcium and protein.   Meds ordered this encounter  Medications  . ondansetron (ZOFRAN-ODT) disintegrating tablet 8 mg  . acetaminophen (TYLENOL) tablet 1,000 mg  . ferrous sulfate 325 (65 FE) MG tablet    Sig: Take 1 tablet (325 mg total) by mouth 3 (three) times daily with meals.    Dispense:  60 tablet    Refill:  3    Order Specific Question:   Supervising Provider    Answer:   Morgan BingPICKENS, CHARLIE [id number]]  . potassium chloride (K-DUR) 10 MEQ tablet    Sig: Take 1 tablet (10 mEq total) by mouth daily.    Dispense:  30 tablet    Refill:  0    Order Specific Question:   Supervising Provider    Answer:   Weber BingPICKENS, CHARLIE [[id number]]    NST reviewed- reactive for gestational age   Treatments in MAU included Zofran 8mg  ODT for n/v- patient did not vomit in MAU prior to administration of treatment with Zofran, patient able to tolerate food and liquid prior to  discharge home- no ketones present in urine. Tylenol 1000mg  for abdominal pain. Patient reports relief of abdominal pain with Tylenol medication. Cervix closed/thick/posterior with no palpation of contractions, no contractions or irritability seen on monitor.   Educated and discussed need to take iron supplementation and potassium supplementation as prescribed. Educated on increasing amount of iron and potassium in the diet daily and having crackers at the bedside so she can eat a snack before getting out of bed to help prevent vomiting in the morning. Rx for iron supplementation and potassium sent to pharmacy of choice. Discussed importance of taken iron supplements 3 times a day with meals and continuing until lab work is reevaluated in 1-2 months.     Pt discharge. Pt stable at time of discharge.   Today's evaluation included a work-up for preterm labor which can be life-threatening for both mom and baby.  Assessment: 1. Iron deficiency anemia, unspecified iron deficiency anemia type   2. Hypokalemia   3. [redacted] weeks gestation of pregnancy   4. Fatigue during pregnancy in third trimester   5. Dizziness   6. Abdominal pain during pregnancy in third trimester     Plan: Discharge home Preterm Labor precautions and fetal kick counts Follow up as scheduled in the office for prenatal care  Rx for iron supplementation and potassium sent to pharmacy of choice  Discussed importance of receiving prenatal care and emergency care with the same practice that she is planning on delivering  with- patient verbalizes understanding.   Follow-up Information    [facility] MATERNITY ADMISSIONS Follow up.   Why:  Return to MAU as needed for emergencies  Contact information: [address] [id number] mc [place] [address] [phone]          Allergies as of 10/08/2017   No Known Allergies     Medication List    TAKE these medications   ferrous  sulfate 325 (65 FE) MG tablet Take 1 tablet (325 mg total) by mouth 3 (three) times daily with meals. What changed:  when to take this   metoCLOPramide 10 MG tablet Commonly known as:  REGLAN Take 1 tablet (10 mg total) by mouth every 8 (eight) hours as needed for nausea.   multivitamin-prenatal 27-0.8 MG Tabs tablet Take 1 tablet by mouth daily at 12 noon.   potassium chloride 10 MEQ tablet Commonly known as:  K-DUR Take 1 tablet (10 mEq total) by mouth daily.   pseudoephedrine 30 MG tablet Commonly known as:  SUDAFED Take 1 tablet (30 mg total) by mouth every 4 (four) hours as needed for congestion.   sodium chloride 0.65 % Soln nasal spray Commonly known as:  OCEAN Place 1 spray into both nostrils as needed for congestion.      Steward Drone Certified Nurse-Midwife 10/09/2017 6:56 AM

## 2017-10-10 LAB — GC/CHLAMYDIA PROBE AMP (~~LOC~~) NOT AT ARMC
Chlamydia: NEGATIVE
Neisseria Gonorrhea: NEGATIVE

## 2018-03-18 ENCOUNTER — Encounter (HOSPITAL_COMMUNITY): Payer: Self-pay

## 2019-12-13 IMAGING — US US OB TRANSVAGINAL
1 series · 15 of 28 positions shown · non-contrast
Comparison: 04/29/2017

CLINICAL DATA: Pregnancy of unknown location

EXAM:
TRANSVAGINAL OB ULTRASOUND
TECHNIQUE: Transvaginal ultrasound was performed for complete evaluation of the
gestation as well as the maternal uterus, adnexal regions, and
pelvic cul-de-sac.

[Series 1: us ob transvaginal · 40 acquisitions, 15 frames shown]
[im 1/40]
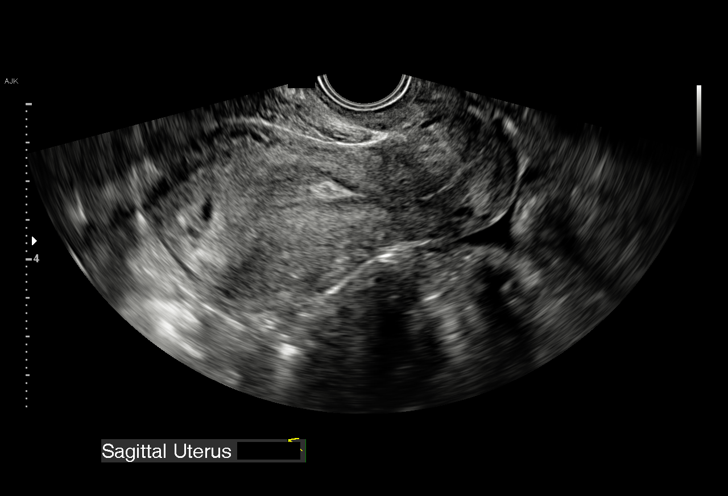
[im 3/40]
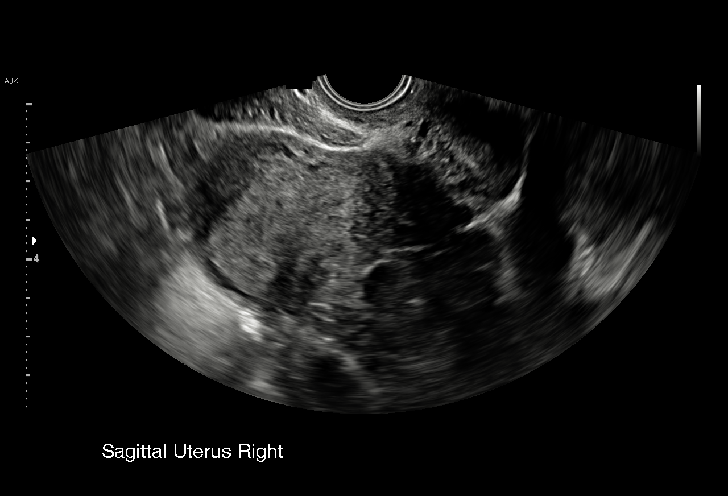
[im 6/40]
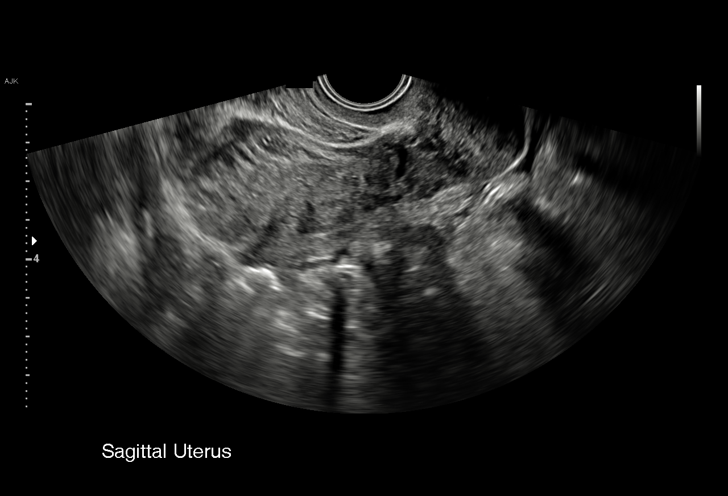
[im 9/40]
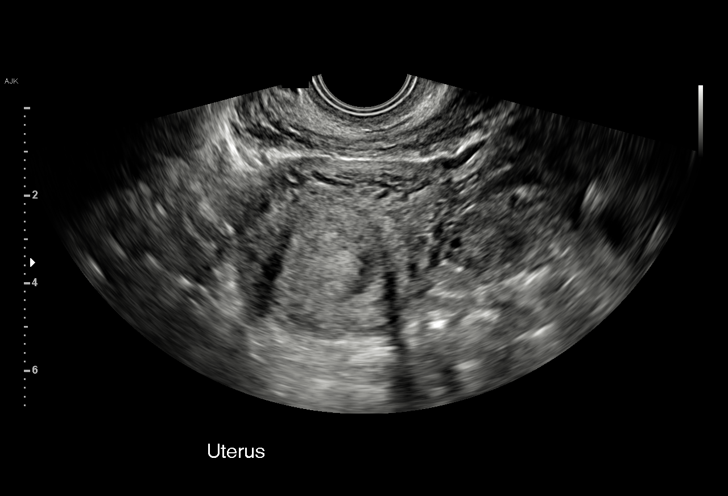
[im 12/40]
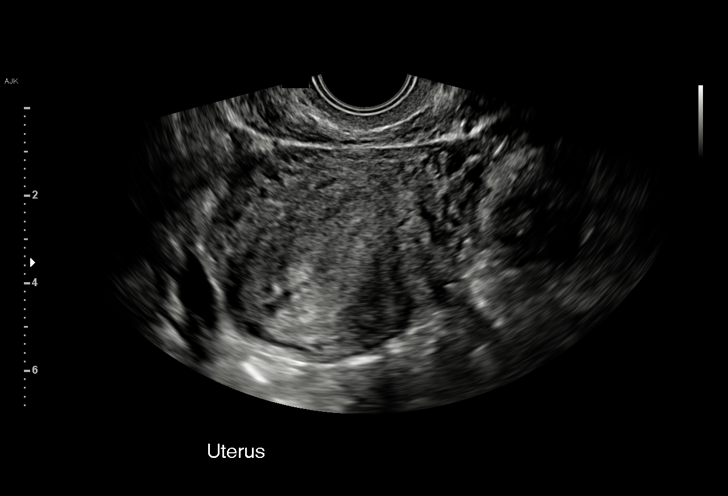
[im 15/40]
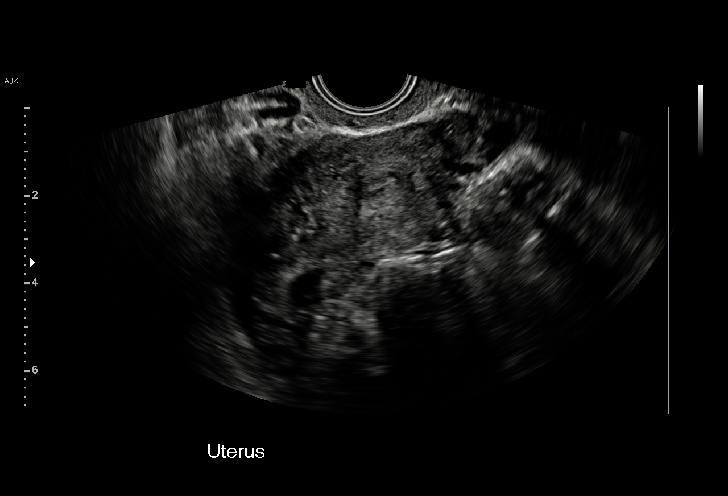
[im 18/40]
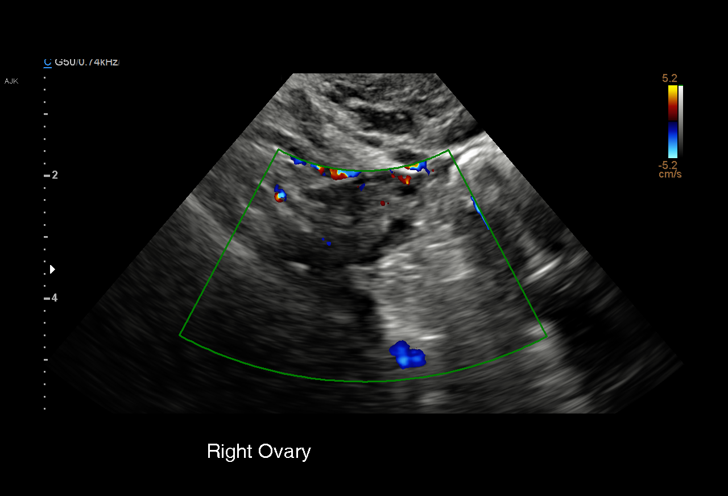
[im 21/40]
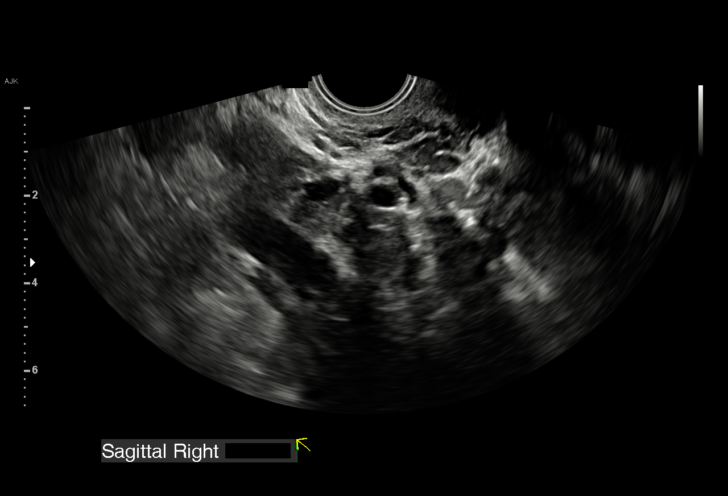
[im 22/40]
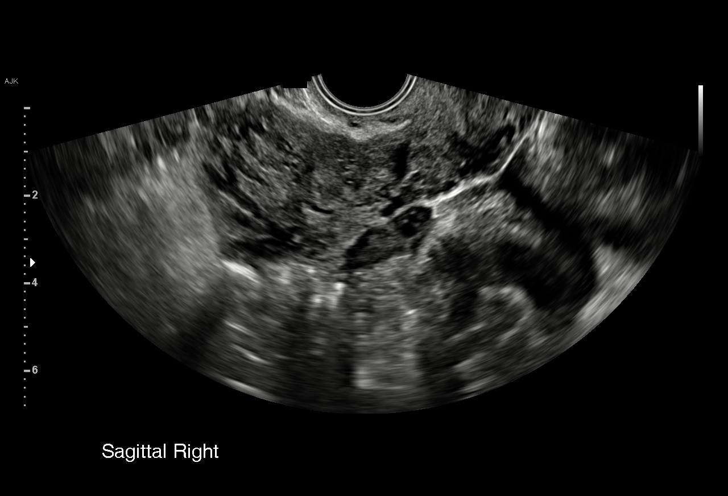
[im 25/40]
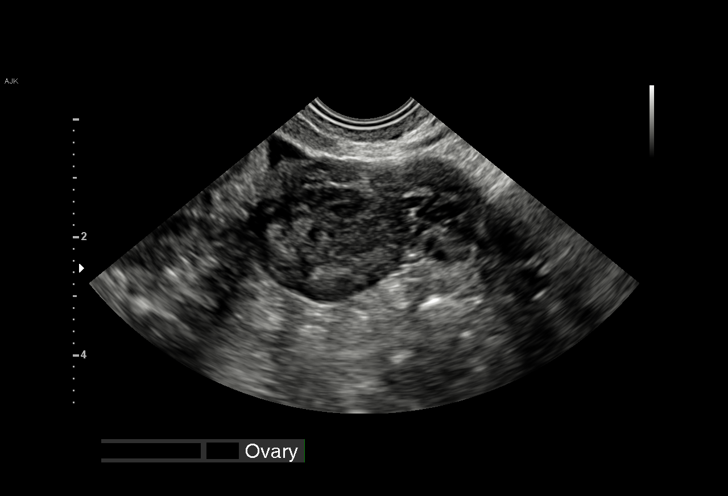
[im 28/40]
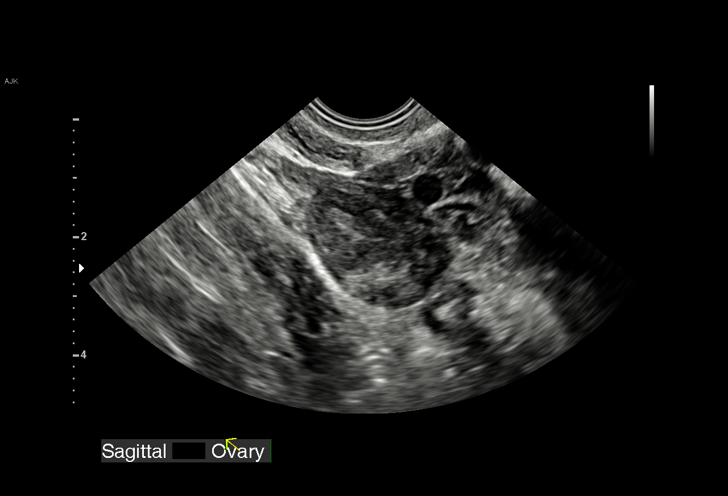
[im 31/40]
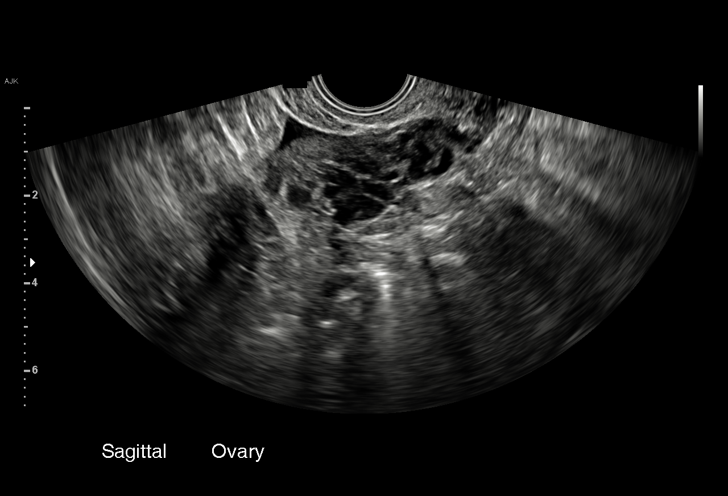
[im 34/40]
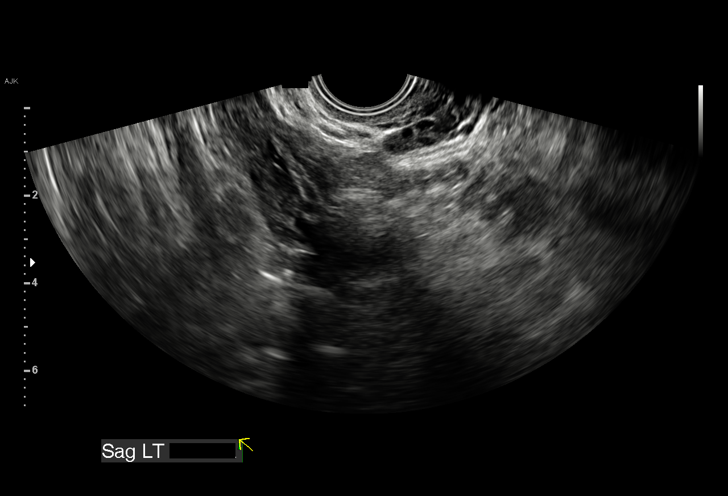
[im 37/40]
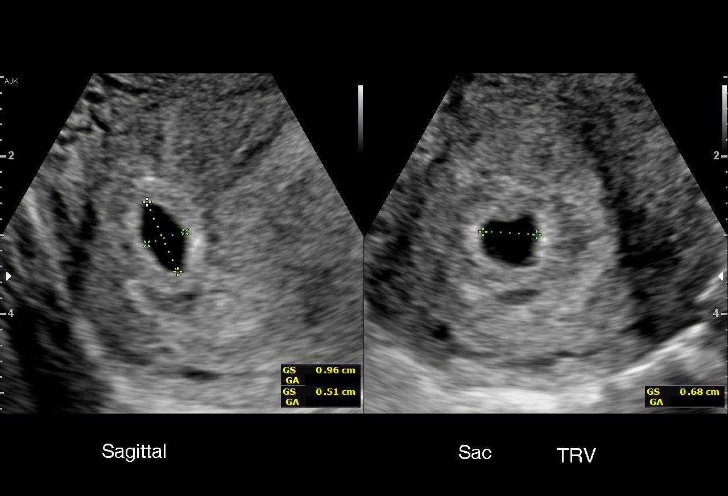
[im 40/40]
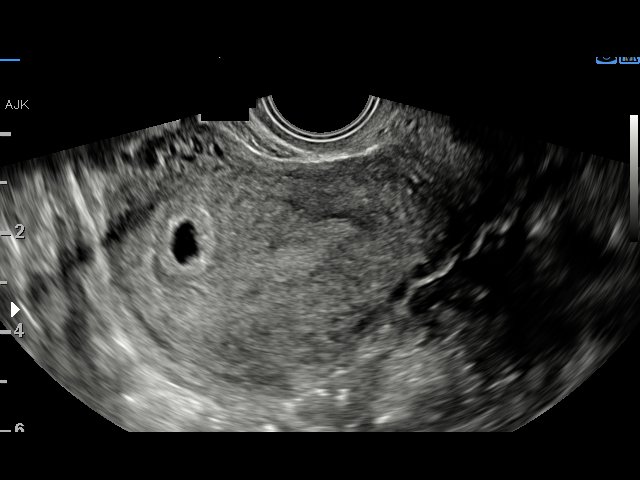

[15 of 28 positions shown; findings below may reference images not displayed]

FINDINGS: Intrauterine gestational sac: Present

Yolk sac:  Questionably visualized

Embryo:  Not visualized

Cardiac Activity: N/A

Heart Rate: N/A bpm

MSD: 7.1  mm   5 w   3  d

Subchorionic hemorrhage:  None

Maternal uterus/adnexae:

LEFT ovary normal size and morphology, 2.8 x 2.6 x 1.9 cm.

RIGHT ovary normal size and morphology, 1.5 x 3.2 x 1.2 cm.

Trace free pelvic fluid.

No adnexal masses.
IMPRESSION: Gestational sac identified within the uterus containing a
questionable yolk sac but no fetal pole is identified.

Otherwise unremarkable exam.

## 2020-02-26 ENCOUNTER — Encounter (HOSPITAL_COMMUNITY): Payer: Self-pay | Admitting: Obstetrics & Gynecology

## 2020-02-26 ENCOUNTER — Inpatient Hospital Stay (HOSPITAL_BASED_OUTPATIENT_CLINIC_OR_DEPARTMENT_OTHER): Payer: Medicaid Other

## 2020-02-26 ENCOUNTER — Inpatient Hospital Stay (HOSPITAL_COMMUNITY)
Admission: AD | Admit: 2020-02-26 | Discharge: 2020-02-26 | Disposition: A | Discharge status: Home / Self Care | Payer: Medicaid Other | Attending: Obstetrics & Gynecology | Admitting: Obstetrics & Gynecology

## 2020-02-26 ENCOUNTER — Other Ambulatory Visit: Payer: Self-pay

## 2020-02-26 DIAGNOSIS — M549 Dorsalgia, unspecified: Secondary | ICD-10-CM | POA: Diagnosis not present

## 2020-02-26 DIAGNOSIS — R109 Unspecified abdominal pain: Secondary | ICD-10-CM | POA: Diagnosis present

## 2020-02-26 DIAGNOSIS — O26892 Other specified pregnancy related conditions, second trimester: Secondary | ICD-10-CM | POA: Diagnosis not present

## 2020-02-26 DIAGNOSIS — Z3A18 18 weeks gestation of pregnancy: Secondary | ICD-10-CM

## 2020-02-26 DIAGNOSIS — O99891 Other specified diseases and conditions complicating pregnancy: Secondary | ICD-10-CM | POA: Insufficient documentation

## 2020-02-26 DIAGNOSIS — D508 Other iron deficiency anemias: Secondary | ICD-10-CM | POA: Insufficient documentation

## 2020-02-26 DIAGNOSIS — O99282 Endocrine, nutritional and metabolic diseases complicating pregnancy, second trimester: Secondary | ICD-10-CM | POA: Insufficient documentation

## 2020-02-26 DIAGNOSIS — O99012 Anemia complicating pregnancy, second trimester: Secondary | ICD-10-CM | POA: Diagnosis not present

## 2020-02-26 DIAGNOSIS — Z3687 Encounter for antenatal screening for uncertain dates: Secondary | ICD-10-CM | POA: Diagnosis not present

## 2020-02-26 DIAGNOSIS — E876 Hypokalemia: Secondary | ICD-10-CM | POA: Insufficient documentation

## 2020-02-26 DIAGNOSIS — O26899 Other specified pregnancy related conditions, unspecified trimester: Secondary | ICD-10-CM

## 2020-02-26 LAB — RAPID URINE DRUG SCREEN, HOSP PERFORMED
Amphetamines: NOT DETECTED
Barbiturates: NOT DETECTED
Benzodiazepines: NOT DETECTED
Cocaine: NOT DETECTED
Opiates: NOT DETECTED
Tetrahydrocannabinol: NOT DETECTED

## 2020-02-26 LAB — URINALYSIS, ROUTINE W REFLEX MICROSCOPIC
Bilirubin Urine: NEGATIVE
Glucose, UA: NEGATIVE mg/dL
Hgb urine dipstick: NEGATIVE
Ketones, ur: NEGATIVE mg/dL
Nitrite: NEGATIVE
Protein, ur: NEGATIVE mg/dL
Specific Gravity, Urine: 1.017 (ref 1.005–1.030)
pH: 7 (ref 5.0–8.0)

## 2020-02-26 LAB — DIFFERENTIAL
Abs Immature Granulocytes: 0.03 10*3/uL (ref 0.00–0.07)
Basophils Absolute: 0.1 10*3/uL (ref 0.0–0.1)
Basophils Relative: 1 %
Eosinophils Absolute: 0.5 10*3/uL (ref 0.0–0.5)
Eosinophils Relative: 6 %
Immature Granulocytes: 0 %
Lymphocytes Relative: 24 %
Lymphs Abs: 1.9 10*3/uL (ref 0.7–4.0)
Monocytes Absolute: 0.5 10*3/uL (ref 0.1–1.0)
Monocytes Relative: 6 %
Neutro Abs: 5.1 10*3/uL (ref 1.7–7.7)
Neutrophils Relative %: 63 %

## 2020-02-26 LAB — CBC
HCT: 29.4 % — ABNORMAL LOW (ref 36.0–46.0)
Hemoglobin: 9.5 g/dL — ABNORMAL LOW (ref 12.0–15.0)
MCH: 23.9 pg — ABNORMAL LOW (ref 26.0–34.0)
MCHC: 32.3 g/dL (ref 30.0–36.0)
MCV: 73.9 fL — ABNORMAL LOW (ref 80.0–100.0)
Platelets: 229 10*3/uL (ref 150–400)
RBC: 3.98 MIL/uL (ref 3.87–5.11)
RDW: 16.9 % — ABNORMAL HIGH (ref 11.5–15.5)
WBC: 8.2 10*3/uL (ref 4.0–10.5)
nRBC: 0 % (ref 0.0–0.2)

## 2020-02-26 LAB — COMPREHENSIVE METABOLIC PANEL
ALT: 13 U/L (ref 0–44)
AST: 18 U/L (ref 15–41)
Albumin: 3 g/dL — ABNORMAL LOW (ref 3.5–5.0)
Alkaline Phosphatase: 53 U/L (ref 38–126)
Anion gap: 7 (ref 5–15)
BUN: <5 mg/dL — ABNORMAL LOW (ref 6–20)
CO2: 23 mmol/L (ref 22–32)
Calcium: 8.9 mg/dL (ref 8.9–10.3)
Chloride: 106 mmol/L (ref 98–111)
Creatinine, Ser: 0.5 mg/dL (ref 0.44–1.00)
GFR, Estimated: >60 mL/min (ref >60)
Glucose, Bld: 72 mg/dL (ref 70–99)
Potassium: 3.2 mmol/L — ABNORMAL LOW (ref 3.5–5.1)
Sodium: 136 mmol/L (ref 135–145)
Total Bilirubin: 0.5 mg/dL (ref 0.3–1.2)
Total Protein: 6.8 g/dL (ref 6.5–8.1)

## 2020-02-26 LAB — WET PREP, GENITAL
Clue Cells Wet Prep HPF POC: NONE SEEN
Sperm: NONE SEEN
Trich, Wet Prep: NONE SEEN
Yeast Wet Prep HPF POC: NONE SEEN

## 2020-02-26 LAB — HEPATITIS B SURFACE ANTIGEN: Hepatitis B Surface Ag: NONREACTIVE

## 2020-02-26 MED ORDER — FERROUS SULFATE 325 (65 FE) MG PO TABS: 325.0000 mg | ORAL_TABLET | Freq: Three times a day (TID) | ORAL | 3 refills | Status: AC

## 2020-02-26 MED ORDER — PRENATAL 27-0.8 MG PO TABS: 1.0000 | ORAL_TABLET | Freq: Every day | ORAL | 4 refills | Status: AC

## 2020-02-26 MED ORDER — CYCLOBENZAPRINE HCL 5 MG PO TABS
10.0000 mg | ORAL_TABLET | Freq: Once | ORAL | Status: AC
  Administered 2020-02-26: 10 mg via ORAL
  Filled 2020-02-26: qty 2

## 2020-02-26 MED ORDER — POTASSIUM CHLORIDE ER 10 MEQ PO TBCR: 10.0000 meq | EXTENDED_RELEASE_TABLET | Freq: Every day | ORAL | 0 refills | Status: AC

## 2020-02-26 NOTE — MAU Provider Note (Signed)
Chief Complaint:  Abdominal Cramping and Back Pain   First Provider Initiated Contact with Patient 02/26/20 1713     HPI: Vanessa Macdonald is a 24 y.o. [id number] at Unknown who presents to maternity admissions reporting back and abdominal pain. She has not had a cycle since her last child was born in April and knows she is pregnant but [facility]. She plans to go to one of the health departments for her care. She thinks she has felt fetal movement but is not sure.  Past Medical History:  Diagnosis Date  . Asthma    OB History  Gravida Para Term Preterm AB Living  5 3 3   1 3   SAB TAB Ectopic Multiple Live Births  1       3    # Outcome Date GA Lbr Len/2nd Weight Sex Delivery Anes PTL Lv  5 Current           4 Term 08/18/19    M    LIV  3 Term 12/26/17    F Vag-Spont   LIV  2 Term 2014     Vag-Spont   LIV  1 SAB            Past Surgical History:  Procedure Laterality Date  . NO PAST SURGERIES     No family history on file. Social History   Tobacco Use  . Smoking status: Never Smoker  . Smokeless tobacco: Never Used  Substance Use Topics  . Alcohol use: No  . Drug use: No   No Known Allergies No medications prior to admission.    I have reviewed patient's Past Medical Hx, Surgical Hx, Family Hx, Social Hx, medications and allergies.   ROS:  Review of Systems  Constitutional: Negative for fatigue and fever.  Eyes: Negative for photophobia and visual disturbance.  Respiratory: Negative for shortness of breath.   Gastrointestinal: Positive for abdominal pain. Negative for diarrhea and nausea.  Musculoskeletal: Positive for back pain.  Neurological: Negative for syncope and headaches.  All other systems reviewed and are negative.   Physical Exam   BP 101/62 (BP Location: Left Arm)   Pulse 89   Temp 98.6 F (37 C) (Oral)   Resp 16   LMP  (LMP Unknown)   SpO2 100%   Constitutional: Thin, poorly nourished female in mild distress due to back discomfort. Pick  marks noted on legs, arms, and face. Thin nails bloodied from picking, redness also noted on teeth. Pt appears in poor health. Cardiovascular: normal rate & rhythm, no murmur Respiratory: normal effort, lung sounds clear throughout GI: Abd soft, non-tender, gravid appropriate for gestational age. Pos BS x 4 MS: Extremities nontender, no edema, normal ROM Neurologic: Alert and oriented x 4.  Pelvic: NEFG, physiologic discharge, no blood, cervix clean.    Labs: OB Panel largely normal except: Anemia - hgb 9.9 Hypokalemia - K+ 3.2 UDS negative  Imaging:   OBSTETRICS REPORT                        (Signed Final 02/27/2020 08:17 am) ---------------------------------------------------------------------- Patient Info  ID #:       [id number]                          D.O.B.:  14-Aug-1995 (24 yrs)  Name:       Vanessa Macdonald  Visit Date: 02/26/2020 06:15 pm ---------------------------------------------------------------------- Performed By  Attending:        Ma Rings MD         Referred By:       [facility] MAU/Triage  Performed By:     Sandi Mealy        [facility] ---------------------------------------------------------------------- Orders  #  Description                           Code        Ordered By  1  [place] MFM OB COMP + 14 WK                16109.60    Barrett Holthaus ----------------------------------------------------------------------  #  Order #                     Accession #                Episode #  1  [id number]                   [id number]                 [id number] ---------------------------------------------------------------------- Indications  Abdominal pain in pregnancy                     O99.89  [redacted] weeks gestation of pregnancy                 Z3A.18  Encounter for uncertain dates                    Z36.87 ---------------------------------------------------------------------- Fetal Evaluation  Num Of Fetuses:          1  Fetal Heart Rate(bpm):   133  Cardiac Activity:        Observed  Presentation:            Cephalic  Placenta:                Posterior  P. Cord Insertion:       Not well visualized  Amniotic Fluid  AFI FV:      Within normal limits                              Largest Pocket(cm)                              3.19 ---------------------------------------------------------------------- Biometry  BPD:      38.3  mm     G. Age:  17w 5d         29  %    CI:        66.76   %    70 - 86  FL/HC:       18.0  %    15.8 - 18  HC:      150.2  mm     G. Age:  18w 1d         39  %    HC/AC:       1.16       1.07 - 1.29  AC:      129.8  mm     G. Age:  18w 4d         60  %    FL/BPD:      70.8  %  FL:       27.1  mm     G. Age:  18w 2d         49  %    FL/AC:       20.9  %    20 - 24  HUM:      23.7  mm     G. Age:  17w 2d         31  %  CER:      17.6  mm     G. Age:  17w 4d         27  %  NFT:       4.3  mm  LV:        6.6  mm  CM:        5.3  mm  Est. FW:     236   gm     0 lb 8 oz     59  % ---------------------------------------------------------------------- Gestational Age  U/S Today:     18w 1d                                        EDD:   07/28/20  Best:          18w 1d     Det. By:  U/S (02/26/20)           EDD:   07/28/20 ---------------------------------------------------------------------- Anatomy  Cranium:               Appears normal         LVOT:                   Not well visualized  Cavum:                 Appears normal         Aortic Arch:            Not well visualized  Ventricles:            Appears normal         Ductal Arch:            Not well visualized  Choroid Plexus:        Appears normal         Diaphragm:              Not well visualized  Cerebellum:            Appears normal          Stomach:                Appears normal, left  sided  Posterior Fossa:       Appears normal         Abdomen:                Appears normal  Nuchal Fold:           Appears normal         Abdominal Wall:         Appears nml (cord                                                                        insert, abd wall)  Face:                  Not well visualized    Cord Vessels:           Not well visualized  Lips:                  Not well visualized    Kidneys:                Appear normal  Palate:                Not well visualized    Bladder:                Appears normal  Thoracic:              Appears normal         Spine:                  Limited views                                                                        appear normal  Heart:                 Appears normal         Upper Extremities:      Appears normal                         (4CH, axis, and                         situs)  RVOT:                  Not well visualized    Lower Extremities:      Appears normal  Other:  Normal genaitalia. Fetus appears to be a female. Technically difficult          due to fetal position. ---------------------------------------------------------------------- Cervix Uterus Adnexa  Cervix  Length:           3.37  cm.  Normal appearance by transabdominal scan. ---------------------------------------------------------------------- Comments  This patient presented to the MAU due to abdominal pain.  She has not started prenatal care and is uncertain of her  dates as she has not had a period  since giving birth earlier  this year.  The fetal biometery measurements obtained today are  consistent with an IUP at 18 weeks and 1 day, giving her an  EDC of 07/28/2020.  There was normal amniotic fluid noted.  The views of the fetal anatomy were limited today due to the  fetal position.  She should have a detailed fetal anatomy scan  scheduled at  the MFM office in 3 to 4 weeks to confirm her dates. ----------------------------------------------------------------------                   Ma Rings, MD Electronically Signed Final Report   02/27/2020 08:17 am  MAU Course: Orders Placed This Encounter  Procedures  . Wet prep, genital  . [place] MFM OB COMP + 14 WK  . Urinalysis, Routine w reflex microscopic Urine, Clean Catch  . Comprehensive metabolic panel  . Hepatitis B surface antigen  . CBC  . Differential  . Rapid urine drug screen (hospital performed)  . Discharge patient   Meds ordered this encounter  Medications  . cyclobenzaprine (FLEXERIL) tablet 10 mg  . potassium chloride (KLOR-CON) 10 MEQ tablet    Sig: Take 1 tablet (10 mEq total) by mouth daily for 3 doses.    Dispense:  3 tablet    Refill:  0    Order Specific Question:   Supervising Provider    Answer:   Reva Bores [[id number]]  . Prenatal Vit-Fe Fumarate-FA (MULTIVITAMIN-PRENATAL) 27-0.8 MG TABS tablet    Sig: Take 1 tablet by mouth daily at 12 noon.    Dispense:  30 tablet    Refill:  4    Order Specific Question:   Supervising Provider    Answer:   Reva Bores [[id number]]  . ferrous sulfate 325 (65 FE) MG tablet    Sig: Take 1 tablet (325 mg total) by mouth 3 (three) times daily with meals.    Dispense:  60 tablet    Refill:  3    Order Specific Question:   Supervising Provider    Answer:   Samara Snide    MDM: OB labs drawn, U/S ordered Meal tray ordered for pt Pt felt better after eating, wanted to know sex of baby and was then eager to leave Discussed importance of establishing routine OB care, pt stated she has an appointment at the [facility] Dept in December (earliest available) Discharged with PNV and additional Fe as well as 3 doses of K-Dur  Assessment: 1. [redacted] weeks gestation of pregnancy   2. Abdominal cramping affecting pregnancy   3. Back pain in pregnancy   4. Iron deficiency anemia secondary to inadequate  dietary iron intake   5. Hypokalemia    Plan: Discharge home in stable condition with preterm labor precautions Encouraged pt to call either [facility] or [facility] to establish prenatal care prior to December   Follow-up Information    Department, [facility]. Call.   Why: to begin prenatal care Contact information: [address] [place] [address] [phone]               Allergies as of 02/26/2020   No Known Allergies     Medication List    STOP taking these medications   metoCLOPramide 10 MG tablet Commonly known as: Reglan   pseudoephedrine 30 MG tablet Commonly known as: SUDAFED   sodium chloride 0.65 % Soln nasal spray Commonly known as: OCEAN     TAKE these  medications   ferrous sulfate 325 (65 FE) MG tablet Take 1 tablet (325 mg total) by mouth 3 (three) times daily with meals.   multivitamin-prenatal 27-0.8 MG Tabs tablet Take 1 tablet by mouth daily at 12 noon.   potassium chloride 10 MEQ tablet Commonly known as: KLOR-CON Take 1 tablet (10 mEq total) by mouth daily for 3 doses.       Edd Arbour, MSN, CNM, [facility] 02/26/20 2100

## 2020-02-26 NOTE — MAU Note (Addendum)
Pt states she thinks she is between 22 and [redacted] weeks pregnant. Has not had a period since before last child who was born 08/18/2019. No prenatal care yet. Has had abdominal cramping and sharp back pain x 2 days. Pain is 10/10. Took Tylenol this morning but it did not help.  Was spotting before but has seen nothing today. Has seen only yellow mucous. +FM. FHR 164 via doppler.

## 2020-02-26 NOTE — Discharge Instructions (Signed)

## 2020-02-27 LAB — RPR: RPR Ser Ql: NONREACTIVE

## 2020-02-27 LAB — RUBELLA SCREEN: Rubella: 2.76 index (ref >0.99)

## 2020-02-29 LAB — GC/CHLAMYDIA PROBE AMP (~~LOC~~) NOT AT ARMC
Chlamydia: NEGATIVE
Comment: NEGATIVE
Comment: NORMAL
Neisseria Gonorrhea: NEGATIVE
